# Patient Record
Sex: Female | Born: 1960 | Race: White | Hispanic: No | Smoking: Former smoker
Health system: Southern US, Community
[De-identification: ages and names within clinical notes are randomized; demographics above are authoritative.]

## PROBLEM LIST (undated history)

## (undated) DIAGNOSIS — I2510 Atherosclerotic heart disease of native coronary artery without angina pectoris: Secondary | ICD-10-CM

## (undated) DIAGNOSIS — F419 Anxiety disorder, unspecified: Secondary | ICD-10-CM

## (undated) DIAGNOSIS — K219 Gastro-esophageal reflux disease without esophagitis: Secondary | ICD-10-CM

## (undated) DIAGNOSIS — J439 Emphysema, unspecified: Secondary | ICD-10-CM

## (undated) DIAGNOSIS — G4733 Obstructive sleep apnea (adult) (pediatric): Secondary | ICD-10-CM

## (undated) DIAGNOSIS — F32A Depression, unspecified: Secondary | ICD-10-CM

## (undated) DIAGNOSIS — K589 Irritable bowel syndrome without diarrhea: Secondary | ICD-10-CM

## (undated) DIAGNOSIS — F329 Major depressive disorder, single episode, unspecified: Secondary | ICD-10-CM

## (undated) DIAGNOSIS — R079 Chest pain, unspecified: Secondary | ICD-10-CM

## (undated) DIAGNOSIS — Q248 Other specified congenital malformations of heart: Secondary | ICD-10-CM

## (undated) DIAGNOSIS — G43909 Migraine, unspecified, not intractable, without status migrainosus: Secondary | ICD-10-CM

## (undated) HISTORY — DX: Chest pain, unspecified: R07.9

## (undated) HISTORY — DX: Gastro-esophageal reflux disease without esophagitis: K21.9

## (undated) HISTORY — DX: Obstructive sleep apnea (adult) (pediatric): G47.33

## (undated) HISTORY — DX: Anxiety disorder, unspecified: F41.9

## (undated) HISTORY — DX: Other specified congenital malformations of heart: Q24.8

## (undated) HISTORY — PX: PARTIAL HYSTERECTOMY: SHX80

## (undated) HISTORY — DX: Migraine, unspecified, not intractable, without status migrainosus: G43.909

## (undated) HISTORY — PX: BREAST BIOPSY: SHX20

## (undated) HISTORY — DX: Irritable bowel syndrome, unspecified: K58.9

## (undated) HISTORY — DX: Emphysema, unspecified: J43.9

## (undated) HISTORY — DX: Atherosclerotic heart disease of native coronary artery without angina pectoris: I25.10

## (undated) HISTORY — DX: Depression, unspecified: F32.A

## (undated) HISTORY — DX: Major depressive disorder, single episode, unspecified: F32.9

---

## 2007-01-20 ENCOUNTER — Encounter: Payer: Self-pay | Admitting: Physician Assistant

## 2007-01-20 ENCOUNTER — Ambulatory Visit: Payer: Self-pay | Admitting: Cardiology

## 2007-01-29 ENCOUNTER — Ambulatory Visit: Payer: Self-pay | Admitting: Cardiology

## 2007-01-30 ENCOUNTER — Encounter: Payer: Self-pay | Admitting: Cardiology

## 2007-01-30 ENCOUNTER — Ambulatory Visit: Payer: Self-pay | Admitting: Cardiology

## 2007-01-30 ENCOUNTER — Inpatient Hospital Stay (HOSPITAL_BASED_OUTPATIENT_CLINIC_OR_DEPARTMENT_OTHER): Admission: RE | Admit: 2007-01-30 | Discharge: 2007-01-30 | Payer: Self-pay | Admitting: Cardiology

## 2007-02-05 ENCOUNTER — Ambulatory Visit: Payer: Self-pay | Admitting: Cardiology

## 2007-02-27 ENCOUNTER — Ambulatory Visit: Payer: Self-pay | Admitting: Cardiothoracic Surgery

## 2007-05-20 ENCOUNTER — Emergency Department (HOSPITAL_COMMUNITY): Admission: EM | Admit: 2007-05-20 | Discharge: 2007-05-20 | Payer: Self-pay | Admitting: Emergency Medicine

## 2007-05-28 ENCOUNTER — Ambulatory Visit (HOSPITAL_COMMUNITY): Admission: RE | Admit: 2007-05-28 | Discharge: 2007-05-28 | Payer: Self-pay | Admitting: Cardiothoracic Surgery

## 2007-05-28 ENCOUNTER — Ambulatory Visit: Payer: Self-pay | Admitting: Cardiovascular Disease

## 2007-06-05 ENCOUNTER — Ambulatory Visit: Payer: Self-pay | Admitting: Cardiothoracic Surgery

## 2007-10-07 ENCOUNTER — Encounter: Payer: Self-pay | Admitting: Cardiology

## 2007-10-15 ENCOUNTER — Ambulatory Visit: Payer: Self-pay | Admitting: Cardiology

## 2007-10-23 ENCOUNTER — Ambulatory Visit: Payer: Self-pay | Admitting: Cardiology

## 2007-10-23 ENCOUNTER — Encounter: Payer: Self-pay | Admitting: Physician Assistant

## 2007-11-13 ENCOUNTER — Encounter: Admission: RE | Admit: 2007-11-13 | Discharge: 2007-11-13 | Payer: Self-pay | Admitting: Cardiothoracic Surgery

## 2007-11-13 ENCOUNTER — Ambulatory Visit: Payer: Self-pay | Admitting: Cardiothoracic Surgery

## 2007-11-24 ENCOUNTER — Ambulatory Visit: Payer: Self-pay | Admitting: Cardiology

## 2008-03-15 ENCOUNTER — Encounter: Payer: Self-pay | Admitting: Cardiology

## 2008-05-20 ENCOUNTER — Encounter: Payer: Self-pay | Admitting: Cardiology

## 2008-12-21 ENCOUNTER — Ambulatory Visit (HOSPITAL_COMMUNITY): Admission: RE | Admit: 2008-12-21 | Discharge: 2008-12-21 | Payer: Self-pay | Admitting: Cardiothoracic Surgery

## 2008-12-26 ENCOUNTER — Ambulatory Visit: Payer: Self-pay | Admitting: Cardiothoracic Surgery

## 2009-03-06 ENCOUNTER — Ambulatory Visit: Payer: Self-pay | Admitting: Cardiology

## 2009-04-07 DIAGNOSIS — R079 Chest pain, unspecified: Secondary | ICD-10-CM | POA: Insufficient documentation

## 2009-04-10 DIAGNOSIS — I2510 Atherosclerotic heart disease of native coronary artery without angina pectoris: Secondary | ICD-10-CM | POA: Insufficient documentation

## 2009-06-12 HISTORY — PX: OTHER SURGICAL HISTORY: SHX169

## 2009-06-16 ENCOUNTER — Ambulatory Visit: Payer: Self-pay | Admitting: Cardiothoracic Surgery

## 2009-06-16 ENCOUNTER — Encounter: Admission: RE | Admit: 2009-06-16 | Discharge: 2009-06-16 | Payer: Self-pay | Admitting: Cardiothoracic Surgery

## 2009-06-22 ENCOUNTER — Encounter: Payer: Self-pay | Admitting: Cardiology

## 2009-06-26 ENCOUNTER — Inpatient Hospital Stay (HOSPITAL_COMMUNITY): Admission: RE | Admit: 2009-06-26 | Discharge: 2009-06-29 | Payer: Self-pay | Admitting: Cardiothoracic Surgery

## 2009-06-26 ENCOUNTER — Encounter: Payer: Self-pay | Admitting: Cardiothoracic Surgery

## 2009-06-26 ENCOUNTER — Ambulatory Visit: Payer: Self-pay | Admitting: Cardiothoracic Surgery

## 2009-06-29 ENCOUNTER — Encounter: Payer: Self-pay | Admitting: Cardiology

## 2009-07-03 ENCOUNTER — Ambulatory Visit: Payer: Self-pay | Admitting: Cardiothoracic Surgery

## 2009-07-10 ENCOUNTER — Ambulatory Visit: Payer: Self-pay | Admitting: Cardiothoracic Surgery

## 2009-07-10 ENCOUNTER — Encounter: Admission: RE | Admit: 2009-07-10 | Discharge: 2009-07-10 | Payer: Self-pay | Admitting: Cardiothoracic Surgery

## 2010-04-11 ENCOUNTER — Encounter: Payer: Self-pay | Admitting: Cardiology

## 2010-06-12 ENCOUNTER — Ambulatory Visit: Payer: Self-pay | Admitting: Cardiology

## 2010-06-12 DIAGNOSIS — F341 Dysthymic disorder: Secondary | ICD-10-CM | POA: Insufficient documentation

## 2010-06-12 DIAGNOSIS — I318 Other specified diseases of pericardium: Secondary | ICD-10-CM | POA: Insufficient documentation

## 2010-07-26 ENCOUNTER — Ambulatory Visit: Payer: Self-pay | Admitting: Cardiology

## 2010-07-26 DIAGNOSIS — J069 Acute upper respiratory infection, unspecified: Secondary | ICD-10-CM | POA: Insufficient documentation

## 2010-07-26 DIAGNOSIS — E785 Hyperlipidemia, unspecified: Secondary | ICD-10-CM | POA: Insufficient documentation

## 2010-09-02 ENCOUNTER — Encounter: Payer: Self-pay | Admitting: Cardiothoracic Surgery

## 2010-09-12 NOTE — Assessment & Plan Note (Signed)
Summary: 3yr/rm   Visit Type:  Follow-up Primary Alf Doyle:  Health Dept   History of Present Illness: the patient is a 50 year old female with a history of pericardial cyst removal.  Patient also has a history of anxiety disorder.  She had a recent breakup.  She partner for 11 years.  She occasionally has chest pain which is atypical.  She reports a decrease in appetite.  She feels very depressed.  She also has increased bowel movements with diarrhea.  She is unable to sleep.  Cardiovascular standpoint however she stable with no chest pain.  She does have palpitations an EKG in the office shows sinus tachycardia but otherwise normal EKG  Preventive Screening-Counseling & Management  Alcohol-Tobacco     Smoking Status: current     Smoking Cessation Counseling: yes     Packs/Day: 2&1/2 PPD  Current Medications (verified): 1)  Nitroglycerin 0.4 Mg Subl (Nitroglycerin) .... Place 1 Tablet Under Tongue As Directed 2)  Metoprolol Succinate 25 Mg Xr24h-Tab (Metoprolol Succinate) .... Take 1 Tablet By Mouth Once A Day 3)  Simvastatin 40 Mg Tabs (Simvastatin) .... Take 1 Tablet By Mouth At Bedtime 4)  Fluoxetine Hcl 40 Mg Caps (Fluoxetine Hcl) .... Take 1 Tablet By Mouth Once A Day. Obtain Further Refills From Primary Md. 5)  Aspirin 81 Mg Tbec (Aspirin) .... Take One Tablet By Mouth Daily 6)  Tylenol 325 Mg Tabs (Acetaminophen) .... As Needed 7)  Trazodone Hcl 100 Mg Tabs (Trazodone Hcl) .... Take 1/2 Tab (50mg ) At Bedtime, May Increase To 1 Tab If Needed 8)  Clonazepam 0.5 Mg Tabs (Clonazepam) .... Take 1 Tablet By Mouth Two Times A Day  Allergies (verified): 1)  ! Pcn 2)  ! Bactrim 3)  ! Ultram  Comments:  Nurse/Medical Assistant: The patient's medications and allergies were verbally reviewed with the patient and were updated in the Medication and Allergy Lists.  Past History:  Past Medical History: Last updated: 04/07/2009 CAD, NATIVE VESSEL (ICD-414.01) CHEST PAIN-UNSPECIFIED  (ICD-786.50) GERD  Past Surgical History: Last updated: 04/07/2009 Partial Hysterectomy  Family History: Last updated: 04/07/2009 Family History of Cancer:  Family History of CVA or Stroke:   Social History: Last updated: 04/07/2009 Tobacco Use - Former.  Alcohol Use - no Drug Use - no  Risk Factors: Smoking Status: current (06/12/2010) Packs/Day: 2&1/2 PPD (06/12/2010)  Past Cardiac History:  [facility] Hospitalizations: ADMITTING DIAGNOSES: 1. A 3-cm pericardial cyst. 2. History of atypical chest pain (with normal coronaries). 3. History of anxiety disorder. 4. History of tobacco abuse (recently quit).   DISCHARGE DIAGNOSES: 1. A 3-cm pericardial cyst. 2. History of atypical chest pain (with normal coronaries). 3. History of anxiety disorder. 4. History of tobacco abuse (recently quit).   PROCEDURE:  Left VAT, resection of pericardial cyst, placement of On-Q by [name] on June 26, 2009.  Pathology, benign pericardial cyst.  Social History: Smoking Status:  current Packs/Day:  2&1/2 PPD  Review of Systems       The patient complains of anorexia, weight loss, fatigue, malaise, chest pain, diarrhea, depression, and anxiety.  The patient denies fever, weight gain/loss, vision loss, decreased hearing, hoarseness, palpitations, shortness of breath, prolonged cough, wheezing, sleep apnea, coughing up blood, abdominal pain, blood in stool, nausea, vomiting, heartburn, incontinence, blood in urine, muscle weakness, joint pain, leg swelling, rash, skin lesions, headache, fainting, dizziness, enlarged lymph nodes, easy bruising or bleeding, and environmental allergies.    Vital Signs:  Patient profile:   50 year old female  Height:      64 inches Weight:      117 pounds BMI:     20.16  Vitals Entered By: Carlye Grippe (June 12, 2010 11:17 AM)  Physical Exam  Additional Exam:  General: Well-developed, well-nourished in no distress head: Normocephalic and  atraumatic eyes PERRLA/EOMI intact, conjunctiva and lids normal nose: No deformity or lesions mouth normal dentition, normal posterior pharynx neck: Supple, no JVD.  No masses, thyromegaly or abnormal cervical nodes lungs: Normal breath sounds bilaterally without wheezing.  Normal percussion heart: regular rate and rhythm with normal S1 and S2, no S3 or S4.  PMI is normal.  No pathological murmurs abdomen: Normal bowel sounds, abdomen is soft and nontender without masses, organomegaly or hernias noted.  No hepatosplenomegaly musculoskeletal: Back normal, normal gait muscle strength and tone normal pulsus: Pulse is normal in all 4 extremities Extremities: No peripheral pitting edema neurologic: Alert and oriented x 3 skin: Intact without lesions or rashes cervical nodes: No significant adenopathy psychologic: depressed affect    EKG  Procedure date:  06/12/2010  Findings:      sinus tachycardia heart rate of hundred beats/min otherwise normal tracing.  Impression & Recommendations:  Problem # 1:  OTHER SPECIFIED DISEASES OF PERICARDIUM (ICD-423.8) the patient is status post pericardial cyst removal.  She has no recurrent symptoms.  Problem # 2:  ANXIETY DEPRESSION (ICD-300.4) the patient has severe symptoms of anxiety and depression.  I increased her Prozac to 40 mg a day and added trazodone 50 Millis p.o. nightly.  We also have given her clonazepam .5 mg p.o. b.i.d. the patient will follow-up with me in the next couple of weeks to make sure she is tolerating this regimen.  I discussed with her at length the risks and side effects of the medications, as well as the potential benefits  Problem # 3:  CHEST PAIN-UNSPECIFIED (ICD-786.50) Assessment: Comment Only atypical and white related to anxiety. Her updated medication list for this problem includes:    Nitroglycerin 0.4 Mg Subl (Nitroglycerin) .Marland Kitchen... Place 1 tablet under tongue as directed    Metoprolol Succinate 25 Mg Xr24h-tab  (Metoprolol succinate) .Marland Kitchen... Take 1 tablet by mouth once a day    Aspirin 81 Mg Tbec (Aspirin) .Marland Kitchen... Take one tablet by mouth daily  Other Orders: EKG w/ Interpretation (93000)  Patient Instructions: 1)  Trazodone 100mg  at bedtime, start with 50mg  at first 2)  Clonazepam 0.5mg  two times a day  3)  Continue Prozac at current dose 4)  Stop Norvasc 5)  Begin Metoprolol ER 25mg  daily  6)  Peppermint Oil - one tab one hour before meals three times a day  7)  Follow up in  2 weeks Prescriptions: SIMVASTATIN 40 MG TABS (SIMVASTATIN) Take 1 tablet by mouth at bedtime  #30 x 6   Entered by:   Hoover Brunette, LPN   Authorized by:   Lewayne Bunting, MD, [facility]   Signed by:   Hoover Brunette, LPN on 47/82/9562   Method used:   Electronically to        [facility] # [phone]* (retail)       [address]       [place], [place]  [address]       Ph: [phone]       Fax: [phone]   RxID:   [id number] CLONAZEPAM 0.5 MG TABS (CLONAZEPAM) Take 1 tablet by mouth two times a day  #60 x 1   Entered by:   Hoover Brunette,  LPN   Authorized by:   Lewayne Bunting, MD, [facility]   Signed by:   Hoover Brunette, LPN on 66/01/3015   Method used:   Print then Give to Patient   RxID:   [id number]   Handout requested. METOPROLOL SUCCINATE 25 MG XR24H-TAB (METOPROLOL SUCCINATE) Take 1 tablet by mouth once a day  #30 x 6   Entered by:   Hoover Brunette, LPN   Authorized by:   Lewayne Bunting, MD, [facility]   Signed by:   Hoover Brunette, LPN on 42/70/6237   Method used:   Electronically to        [facility] # [phone]* (retail)       [address]       [place], [place]  [address]       Ph: [phone]       Fax: [phone]   RxID:   [id number]   Handout requested. TRAZODONE HCL 100 MG TABS (TRAZODONE HCL) take 1/2 tab (50mg ) at bedtime, may increase to 1 tab if needed  #30 x 1   Entered by:   Hoover Brunette, LPN   Authorized by:   Lewayne Bunting, MD, [facility]   Signed by:   Hoover Brunette, LPN on 96/78/9381   Method used:   Electronically to         [facility] # [phone]* (retail)       [address]       [place], [place]  [address]       Ph: [phone]       Fax: [phone]   RxID:   [id number]   Handout requested.

## 2010-09-12 NOTE — Cardiovascular Report (Signed)
Summary: Cardiac Catheterization  Cardiac Catheterization   Imported By: Dorise Hiss 04/09/2010 14:35:13  _____________________________________________________________________  External Attachment:    Type:   Image     Comment:   External Document

## 2010-09-12 NOTE — Letter (Signed)
Summary: Appointment -missed   [facility] [address]   [place], [place] [address]   Phone: [phone]  Fax: [phone]     April 11, 2010 MRN: [id number]     [facility] [address] [place], [place]  [address]     Dear Ms. YOUNG,  Our records indicate you missed your appointment on April 11, 2010                        with Dr.  Andee Lineman.   It is very important that we reach you to reschedule this appointment. We look forward to participating in your health care needs.   Please contact us at the number listed above at your earliest convenience to reschedule this appointment.   Sincerely,    Glass blower/designer

## 2010-09-13 NOTE — Assessment & Plan Note (Signed)
Summary: f2w  -- agh   Visit Type:  Follow-up Primary Provider:  Health Dept   History of Present Illness: the patient is a 50 year old female whom I saw several weeks ago with severe symptoms of anxiety and depression. The patient was going through a breakup with her partner. Her mood is much improved and she is much less tearful. However she is very concerned about ongoing weight loss. Since July she has lost 40 pounds and lost another 9 pounds since her last office visit. She is taking a higher dose of fluoxetine 40 mg daily as well as lorazepam low dose twice a day and p.r.n. trazodone for sleep. The patient states that she does not always need her trazodone. Overall she feels significantly improved.  The patient over the last 2 days does report a cough and upper airway congestion and purulent drainage from the nasal cavity. She's had fevers up to 102. She reports no shortness of breath orthopnea PND. She has noticed some cervical lymphadenopathy and has a sore throat.  from a cardiac standpoint the patient is stable. We have followed this patient for prior pericardial cyst removal. Prior cardiac catheterization showed no coronary artery disease.  Preventive Screening-Counseling & Management  Alcohol-Tobacco     Smoking Status: current     Smoking Cessation Counseling: yes     Packs/Day: <1 PPD  Current Medications (verified): 1)  Nitroglycerin 0.4 Mg Subl (Nitroglycerin) .... Place 1 Tablet Under Tongue As Directed 2)  Metoprolol Succinate 25 Mg Xr24h-Tab (Metoprolol Succinate) .... Take 1 Tablet By Mouth Once A Day 3)  Simvastatin 40 Mg Tabs (Simvastatin) .... Take 1 Tablet By Mouth At Bedtime 4)  Fluoxetine Hcl 40 Mg Caps (Fluoxetine Hcl) .... Take 1 Tablet By Mouth Once A Day. Obtain Further Refills From Primary Md. 5)  Aspirin 81 Mg Tbec (Aspirin) .... Take One Tablet By Mouth Daily 6)  Tylenol 325 Mg Tabs (Acetaminophen) .... As Needed 7)  Remeron 15 Mg Tabs (Mirtazapine) ....  Take 1 Tab By Mouth At Bedtime 8)  Clonazepam 0.5 Mg Tabs (Clonazepam) .... Take 1 Tablet By Mouth Two Times A Day 9)  Zithromax Z-Pak 250 Mg Tabs (Azithromycin) .... Take As Directed  Allergies (verified): 1)  ! Pcn 2)  ! Bactrim 3)  ! Ultram  Comments:  Nurse/Medical Assistant: The patient is currently on medications but does not know the name or dosage at this time. Instructed to contact our office with details. Will update medication list at that time.  Past History:  Past Medical History: Last updated: 04/07/2009 CAD, NATIVE VESSEL (ICD-414.01) CHEST PAIN-UNSPECIFIED (ICD-786.50) GERD  Past Cardiac History:  [facility] Hospitalizations: ADMITTING DIAGNOSES: 1. A 3-cm pericardial cyst. 2. History of atypical chest pain (with normal coronaries). 3. History of anxiety disorder. 4. History of tobacco abuse (recently quit).   DISCHARGE DIAGNOSES: 1. A 3-cm pericardial cyst. 2. History of atypical chest pain (with normal coronaries). 3. History of anxiety disorder. 4. History of tobacco abuse (recently quit).   PROCEDURE:  Left VAT, resection of pericardial cyst, placement of On-Q by [name] on June 26, 2009.  Pathology, benign pericardial cyst.  Social History: Packs/Day:  <1 PPD  Review of Systems       The patient complains of fatigue, hoarseness, prolonged cough, depression, and anxiety.  The patient denies malaise, fever, weight gain/loss, vision loss, decreased hearing, chest pain, palpitations, shortness of breath, wheezing, sleep apnea, coughing up blood, abdominal pain, blood in stool, nausea, vomiting, diarrhea, heartburn, incontinence,  blood in urine, muscle weakness, joint pain, leg swelling, rash, skin lesions, headache, fainting, dizziness, enlarged lymph nodes, easy bruising or bleeding, and environmental allergies.    Vital Signs:  Patient profile:   50 year old female Height:      64 inches Weight:      108 pounds Pulse rate:   86 / minute BP  sitting:   118 / 76  (left arm) Cuff size:   regular  Vitals Entered By: Carlye Grippe (July 26, 2010 10:00 AM)  Physical Exam  Additional Exam:  General: Well-developed, well-nourished in no distress head: Normocephalic and atraumatic eyes PERRLA/EOMI intact, conjunctiva and lids normal nose: No deformity or lesions mouth normal dentition, inflamed posterior pharynx neck: Supple, no JVD.  No masses, thyromegaly, some shotty cervical adenopathy is noted lungs: Normal breath sounds bilaterally without wheezing.  Normal percussion heart: regular rate and rhythm with normal S1 and S2, no S3 or S4.  PMI is normal.  No pathological murmurs abdomen: Normal bowel sounds, abdomen is soft and nontender without masses, organomegaly or hernias noted.  No hepatosplenomegaly musculoskeletal: Back normal, normal gait muscle strength and tone normal pulsus: Pulse is normal in all 4 extremities Extremities: No peripheral pitting edema neurologic: Alert and oriented x 3 skin: Intact without lesions or rashes cervical nodes: No significant adenopathy psychologic: depressed affect    Impression & Recommendations:  Problem # 1:  ANXIETY DEPRESSION (ICD-300.4) the patient has significant improvement under medical therapy for anxiety and depression. However she still remains somewhat tearful and she continues to lose weight which I suspect is secondary to her mood disorder. She does receive regular mammograms and regular physical checkups. Her weight loss is rather acute in aspect is related to her depression. I told the patient that we would DC trazodone and I would like for her to take mirtazapine 15 mg p.o. q.h.s. every night for insomnia and also to improve her appetite. I do think this is a better choice as is associated with weight gain. I will leave her on the low dose due to his interaction with fluoxetine causing an increase in mirtazapine levels. I also discussed with patient care for the  possibility of serotonin syndrome.  Problem # 2:  UPPER RESPIRATORY INFECTION (ICD-465.9) the patient appears to have an upper respiratory infection associated fevers up to 102, rhinitis and congestion. I do not think this represents a serotonin syndrome given the fact that the patient does not always take her trazodone. We will monitor closely for his however her clinical syndrome points towards an upper respiratory infection and have given the patient a Z-Pak for 5 days. Her updated medication list for this problem includes:    Zithromax Z-pak 250 Mg Tabs (Azithromycin) .Marland Kitchen... Take as directed  Problem # 3:  HYPERLIPIDEMIA (ICD-272.4) given the patient's significant weight loss I told her that he would be reasonable to stop simvastatin for now. In 6 months we can check her lipid panel. She has no historyof coronary artery disease and a statin and was only using the setting off primary prevention, which has no survival benefit in women for primary prevention. Her updated medication list for this problem includes:    Simvastatin 40 Mg Tabs (Simvastatin) .Marland Kitchen... Take 1 tablet by mouth at bedtime  Patient Instructions: 1)  Stop Trazodone  2)  Begin Remeron 15mg  at bedtime  3)  Z-pack x 5 days  4)  Follow up in  3 months Prescriptions: ZITHROMAX Z-PAK 250 MG TABS (AZITHROMYCIN) take  as directed  #1 x 0   Entered by:   Hoover Brunette, LPN   Authorized by:   Lewayne Bunting, MD, [facility]   Signed by:   Hoover Brunette, LPN on 36/64/4034   Method used:   Electronically to        [facility]* (retail)       [address]       [place], [place]  [address]       Ph: [phone]       Fax: [phone]   RxID:   [id number] REMERON 15 MG TABS (MIRTAZAPINE) Take 1 tab by mouth at bedtime  #30 x 2   Entered by:   Hoover Brunette, LPN   Authorized by:   Lewayne Bunting, MD, [facility]   Signed by:   Hoover Brunette, LPN on 32/20/2542   Method used:   Electronically to        [facility]  (retail)       [address]       [place], [place]  [address]       Ph: [phone]       Fax: [phone]   RxID:   [id number]   Handout requested.

## 2010-11-14 LAB — BLOOD GAS, ARTERIAL
Acid-Base Excess: 0.2 mmol/L (ref 0.0–2.0)
Acid-Base Excess: 2 mmol/L (ref 0.0–2.0)
Acid-Base Excess: 2 mmol/L (ref 0.0–2.0)
Acid-Base Excess: 4.9 mmol/L — ABNORMAL HIGH (ref 0.0–2.0)
Bicarbonate: 27.3 mEq/L — ABNORMAL HIGH (ref 20.0–24.0)
Bicarbonate: 27.4 mEq/L — ABNORMAL HIGH (ref 20.0–24.0)
Bicarbonate: 28.3 mEq/L — ABNORMAL HIGH (ref 20.0–24.0)
Bicarbonate: 29.1 mEq/L — ABNORMAL HIGH (ref 20.0–24.0)
Delivery systems: POSITIVE
Drawn by: 129711
Drawn by: 129711
Drawn by: 132
Drawn by: 206361
Expiratory PAP: 6
FIO2: 0.21 %
FIO2: 0.4 %
FIO2: 0.5 %
Inspiratory PAP: 12
Mode: POSITIVE
O2 Content: 2 L/min
O2 Saturation: 97.9 %
O2 Saturation: 98.5 %
O2 Saturation: 99.4 %
O2 Saturation: 99.5 %
PEEP: 6 cmH2O
Patient temperature: 98.6
Patient temperature: 98.6
Patient temperature: 98.6
Patient temperature: 98.6
Pressure support: 12 cmH2O
RATE: 14 resp/min
TCO2: 29 mmol/L (ref 0–100)
TCO2: 29.4 mmol/L (ref 0–100)
TCO2: 30.2 mmol/L (ref 0–100)
TCO2: 30.4 mmol/L (ref 0–100)
pCO2 arterial: 44.5 mmHg (ref 35.0–45.0)
pCO2 arterial: 53.3 mmHg — ABNORMAL HIGH (ref 35.0–45.0)
pCO2 arterial: 63.6 mmHg (ref 35.0–45.0)
pCO2 arterial: 70.8 mmHg (ref 35.0–45.0)
pH, Arterial: 7.21 — ABNORMAL LOW (ref 7.350–7.400)
pH, Arterial: 7.27 — ABNORMAL LOW (ref 7.350–7.400)
pH, Arterial: 7.331 — ABNORMAL LOW (ref 7.350–7.400)
pH, Arterial: 7.431 — ABNORMAL HIGH (ref 7.350–7.400)
pO2, Arterial: 143 mmHg — ABNORMAL HIGH (ref 80.0–100.0)
pO2, Arterial: 218 mmHg — ABNORMAL HIGH (ref 80.0–100.0)
pO2, Arterial: 225 mmHg — ABNORMAL HIGH (ref 80.0–100.0)
pO2, Arterial: 87.4 mmHg (ref 80.0–100.0)

## 2010-11-14 LAB — COMPREHENSIVE METABOLIC PANEL
ALT: 15 U/L (ref 0–35)
ALT: 16 U/L (ref 0–35)
AST: 20 U/L (ref 0–37)
AST: 16 U/L (ref 0–37)
Albumin: 3.4 g/dL — ABNORMAL LOW (ref 3.5–5.2)
Albumin: 3.9 g/dL (ref 3.5–5.2)
Alkaline Phosphatase: 48 U/L (ref 39–117)
Alkaline Phosphatase: 66 U/L (ref 39–117)
BUN: 3 mg/dL — ABNORMAL LOW (ref 6–23)
BUN: 13 mg/dL (ref 6–23)
CO2: 31 mEq/L (ref 19–32)
CO2: 26 mEq/L (ref 19–32)
Calcium: 9.4 mg/dL (ref 8.4–10.5)
Calcium: 9.5 mg/dL (ref 8.4–10.5)
Chloride: 102 mEq/L (ref 96–112)
Chloride: 104 mEq/L (ref 96–112)
Creatinine, Ser: 0.56 mg/dL (ref 0.4–1.2)
Creatinine, Ser: 0.79 mg/dL (ref 0.4–1.2)
GFR calc Af Amer: >60 mL/min (ref >60)
GFR calc Af Amer: >60 mL/min (ref >60)
GFR calc non Af Amer: >60 mL/min (ref >60)
GFR calc non Af Amer: >60 mL/min (ref >60)
Glucose, Bld: 112 mg/dL — ABNORMAL HIGH (ref 70–99)
Glucose, Bld: 81 mg/dL (ref 70–99)
Potassium: 4.5 mEq/L (ref 3.5–5.1)
Potassium: 4.1 mEq/L (ref 3.5–5.1)
Sodium: 139 mEq/L (ref 135–145)
Sodium: 138 mEq/L (ref 135–145)
Total Bilirubin: 0.5 mg/dL (ref 0.3–1.2)
Total Bilirubin: 0.4 mg/dL (ref 0.3–1.2)
Total Protein: 6.6 g/dL (ref 6.0–8.3)
Total Protein: 6.8 g/dL (ref 6.0–8.3)

## 2010-11-14 LAB — URINALYSIS, ROUTINE W REFLEX MICROSCOPIC
Bilirubin Urine: NEGATIVE
Glucose, UA: NEGATIVE mg/dL
Hgb urine dipstick: NEGATIVE
Ketones, ur: NEGATIVE mg/dL
Nitrite: NEGATIVE
Protein, ur: NEGATIVE mg/dL
Specific Gravity, Urine: 1.022 (ref 1.005–1.030)
Urobilinogen, UA: 1 mg/dL (ref 0.0–1.0)
pH: 7 (ref 5.0–8.0)

## 2010-11-14 LAB — POCT I-STAT 3, ART BLOOD GAS (G3+)
Acid-Base Excess: 2 mmol/L (ref 0.0–2.0)
Acid-Base Excess: 2 mmol/L (ref 0.0–2.0)
Acid-Base Excess: 3 mmol/L — ABNORMAL HIGH (ref 0.0–2.0)
Bicarbonate: 28.6 mEq/L — ABNORMAL HIGH (ref 20.0–24.0)
Bicarbonate: 28.5 mEq/L — ABNORMAL HIGH (ref 20.0–24.0)
Bicarbonate: 29.2 mEq/L — ABNORMAL HIGH (ref 20.0–24.0)
O2 Saturation: 95 %
O2 Saturation: 98 %
O2 Saturation: 99 %
Patient temperature: 98.4
Patient temperature: 98.5
Patient temperature: 98.6
TCO2: 30 mmol/L (ref 0–100)
TCO2: 30 mmol/L (ref 0–100)
TCO2: 31 mmol/L (ref 0–100)
pCO2 arterial: 49.3 mmHg — ABNORMAL HIGH (ref 35.0–45.0)
pCO2 arterial: 50.6 mmHg — ABNORMAL HIGH (ref 35.0–45.0)
pCO2 arterial: 51.5 mmHg — ABNORMAL HIGH (ref 35.0–45.0)
pH, Arterial: 7.371 (ref 7.350–7.400)
pH, Arterial: 7.351 (ref 7.350–7.400)
pH, Arterial: 7.369 (ref 7.350–7.400)
pO2, Arterial: 77 mmHg — ABNORMAL LOW (ref 80.0–100.0)
pO2, Arterial: 114 mmHg — ABNORMAL HIGH (ref 80.0–100.0)
pO2, Arterial: 139 mmHg — ABNORMAL HIGH (ref 80.0–100.0)

## 2010-11-14 LAB — CBC
HCT: 33.3 % — ABNORMAL LOW (ref 36.0–46.0)
HCT: 33.8 % — ABNORMAL LOW (ref 36.0–46.0)
HCT: 34 % — ABNORMAL LOW (ref 36.0–46.0)
Hemoglobin: 11.6 g/dL — ABNORMAL LOW (ref 12.0–15.0)
Hemoglobin: 11.7 g/dL — ABNORMAL LOW (ref 12.0–15.0)
Hemoglobin: 12 g/dL (ref 12.0–15.0)
MCHC: 34.7 g/dL (ref 30.0–36.0)
MCHC: 34.8 g/dL (ref 30.0–36.0)
MCHC: 35.2 g/dL (ref 30.0–36.0)
MCV: 99.7 fL (ref 78.0–100.0)
MCV: 99.7 fL (ref 78.0–100.0)
MCV: 98.3 fL (ref 78.0–100.0)
Platelets: 249 10*3/uL (ref 150–400)
Platelets: 280 10*3/uL (ref 150–400)
Platelets: 254 10*3/uL (ref 150–400)
RBC: 3.34 MIL/uL — ABNORMAL LOW (ref 3.87–5.11)
RBC: 3.39 MIL/uL — ABNORMAL LOW (ref 3.87–5.11)
RBC: 3.46 MIL/uL — ABNORMAL LOW (ref 3.87–5.11)
RDW: 13.2 % (ref 11.5–15.5)
RDW: 13.3 % (ref 11.5–15.5)
RDW: 13.1 % (ref 11.5–15.5)
WBC: 10.6 10*3/uL — ABNORMAL HIGH (ref 4.0–10.5)
WBC: 8.4 10*3/uL (ref 4.0–10.5)
WBC: 6.9 10*3/uL (ref 4.0–10.5)

## 2010-11-14 LAB — TYPE AND SCREEN
ABO/RH(D): O POS
Antibody Screen: NEGATIVE

## 2010-11-14 LAB — BASIC METABOLIC PANEL
BUN: 4 mg/dL — ABNORMAL LOW (ref 6–23)
CO2: 29 mEq/L (ref 19–32)
Calcium: 9.1 mg/dL (ref 8.4–10.5)
Chloride: 105 mEq/L (ref 96–112)
Creatinine, Ser: 0.51 mg/dL (ref 0.4–1.2)
GFR calc Af Amer: >60 mL/min (ref >60)
GFR calc non Af Amer: >60 mL/min (ref >60)
Glucose, Bld: 121 mg/dL — ABNORMAL HIGH (ref 70–99)
Potassium: 4 mEq/L (ref 3.5–5.1)
Sodium: 138 mEq/L (ref 135–145)

## 2010-11-14 LAB — PROTIME-INR
INR: 1.02 (ref 0.00–1.49)
Prothrombin Time: 13.3 seconds (ref 11.6–15.2)

## 2010-11-14 LAB — URINE MICROSCOPIC-ADD ON

## 2010-11-14 LAB — APTT: aPTT: 33 seconds (ref 24–37)

## 2010-11-14 LAB — ABO/RH: ABO/RH(D): O POS

## 2010-11-14 LAB — MRSA PCR SCREENING: MRSA by PCR: NEGATIVE

## 2010-12-25 NOTE — Consult Note (Signed)
NEW PATIENT CONSULTATION   [place], [name] [name]  DOB:  [birth year]                                        February 27, 2006  CHART #:  [id number]   REASON FOR CONSULTATION:  A 3x1 cm pericardial cyst by CT scan.   CHIEF COMPLAINT:  Atypical chest pain.   HISTORY OF PRESENT ILLNESS:  I was asked to evaluate this 50 year old  white female, ex-smoker, who recently was evaluated for atypical chest  pain and [facility].  She ruled out for MI and had no acute EKG  changes.  A stress test was performed (Cardiolite) which was equivocal,  and ischemia could not be ruled out.  He subsequently underwent cardiac  catheterization by Dr. Charlies Constable in mid June of this year which  demonstrated a mild 50% stenosis of the mid LAD, otherwise, no other  significant coronary artery disease and no evidence of valvular heart  disease.  The patient underwent a CT scan to rule out other etiology of  her pain, and this showed a pericardial cyst measuring 3x1 cm at the  left aspect of the cardiac border.  There is no pericardial effusion.  No pulmonary nodules or masses.  No pleural effusion.  At the same time,  an abnormality in the left breast was felt, and a subsequent mammogram  by her report demonstrated fibrocystic changes and no biopsies or  further studies are planned.  Because of the finding on the CT scan of  the probable incidental pericardial cyst, a thoracic surgical evaluation  was requested.  Of not, the patient's mother recently was found to have  breast cancer.   PAST MEDICAL HISTORY:  1. Status post hysterectomy.  2. Anxiety disorder.  3. Allergy to penicillin.   MEDICATIONS:  1. Klonopin q.h.s.  2. Aspirin 81 mg daily.   SOCIAL HISTORY:  The patient lives with her family, quit smoking two  years ago.  She denies significant alcohol intake.   FAMILY HISTORY:  Sister has tachy palpitations on a beta blocker, and  her mother recently died of cancer.   REVIEW OF SYSTEMS:  CONSTITUTIONAL:  Negative for fever or weight loss.  ENT:  Negative for difficulty swallowing or dental problems.  THORACIC:  Negative for chest trauma or recent upper respiratory infection or  hemoptysis.  CARDIAC:  Negative for significant coronary disease by  recent cath.  Good LV function.  She has a small pericardial cyst.  GI:  Positive for mild nausea.  Negative for blood __________ , hepatitis or  jaundice.  ENDOCRINE:  Negative for diabetes.  VASCULAR:  Negative for  DVT, claudication or TIA.  NEUROLOGICAL:  Negative for stroke or  seizure.   PHYSICAL EXAMINATION:  VITAL SIGNS:  The patient is 5 feet 4 inches and  weighs 133 pounds, blood pressure 114/70, pulse 90, respirations 18,  saturation 98%.  GENERAL:  Pleasant, anxious, middle-aged white female in no acute  distress.  HEENT:  Normocephalic, atraumatic.  EOMs are full.  NECK:  Without JV mass, crepitus or bruits.  LYMPHATICS:  No palpable, supraclavicular or cervical adenopathy.  BREASTS:  Deferred.  LUNGS:  Breath sounds are clear.  CARDIAC:  Regular without S3 gallop or murmur.  ABDOMEN:  Soft without pulsatile mass or organomegaly.  EXTREMITIES:  No cyanosis, clubbing or edema.  Peripheral pulses are 2+  in all extremities.  NEUROLOGICAL:  Intact.   LABORATORY DATA:  I reviewed the patient's prior cath last month.  I  reviewed her CT scan taken at [facility] with the disk that she  brought to the office.  She has a round, well-circumscribed cystic mass  at the left lateral pericardial border.  There is no associated  deformity of the myocardium or pericardial effusion.   IMPRESSION/PLAN:  The patient has an incidental pericardial cyst found  on x-ray.  It is small, and there is no concern this is cause to any of  her symptoms.  I would recommend following the cyst with serial scans.  Would opt for cardiac MRI to reduce long-term radiation exposure.  The  patient will be scheduled for a  cardiac MRI later this fall for initial  baseline exam.  It is rarely that becomes larger, a left _VATS_________  excision would be appropriate to discuss.   [name] [name], M.D.  Electronically Signed   PV/MEDQ  D:  02/27/2007  T:  02/28/2007  Job:  [id number]   cc:   Learta Codding, MD,FACC

## 2010-12-25 NOTE — Assessment & Plan Note (Signed)
[facility]                          [facility] NOTE   NAME:[name] [name], [name] [name]            MRN:          [id number]  DATE:11/24/2007                            DOB:          16-Jun-1961    CARDIOLOGIST:  Learta Codding, MD,FACC.   PRIMARY CARE PHYSICIAN:  None.   REASON FOR VISIT:  One-month follow-up.   HISTORY OF PRESENT ILLNESS:  Ms. [name] [name] is a 50 year old female ex-smoker  who also has a history of nonobstructive coronary artery disease as well  as a 1 x 3 cm pericardial cyst followed by Dr. Kathlee Nations Trigt.  She  returns to the office today for follow-up.  I last saw her in March and  she had had a recent visit to the hospital with chest pain.  Her chest  pain was felt to be fairly atypical.  However, with her history of  nonobstructive coronary disease, we decided to send her for a stress  echocardiogram.  She also complained of significant dysphagia and we set  her up for a barium swallow and placed her on ranitidine 150 mg b.i.d.   The patient's stress echocardiogram was done October 23, 2007.  This was a  normal stress echo study.  There was no scar or ischemia.  Of note, the  patient did exercise for 9 minutes.  Her barium swallow was a normal  study.   In the office today, the patient notes she is doing about the same.  She  denies any recurrent chest pain or shortness breath.  Denies any  syncope, near syncope, orthopnea, PND or pedal edema.  She continues to  note significant problems with dysphagia, odynophagia.  She denies any  recent antibiotic use.   CURRENT MEDICATIONS:  1. Aspirin 81 mg daily.  2. Norvasc 5 mg daily.  3. Multivitamin and B12.  4. Hyomax 0.125 mg p.r.n.  5. Ranitidine 150 mg b.i.d.  6. Simvastatin 20 mg nightly.   ALLERGIES:  PENICILLIN   PHYSICAL EXAMINATION:  GENERAL APPEARANCE:  She is a well-nourished  female in no acute distress.  VITAL SIGNS:  Blood pressure 117/70, pulse 75, weight  143 pounds.  HEENT:  Head:  Normocephalic, atraumatic.  Eyes:  PERRLA, EOMI.  Sclerae  are clear.  Oropharynx is pink without exudate.  LYMPH:  Without lymphadenopathy.  CARDIAC:  Normal S1 and S2, regular rate and rhythm.  LUNGS:  Clear to auscultation bilaterally.  ABDOMEN:  Soft, nontender with normoactive bowel sounds, no  organomegaly.  EXTREMITIES:  Without edema.  SKIN:  Warm and dry.  NEUROLOGIC:  She is alert and oriented x3.  Cranial nerves II-XII  grossly intact.  ENDOCRINE:  Without thyromegaly.   IMPRESSION:  1. Nonobstructive coronary artery disease by cardiac catheterization      June 2008 (50% proximal left anterior descending lesion).      a.     Nonischemic stress echocardiogram March 2009.  2. Good left ventricular function.  3. Gastroesophageal reflux disease with dysphagia and odynophagia.  4. A 1 x 3 cm pericardial cyst followed by [name].      a.  Follow-up CT scan planned in October 2009.  5. Ex-smoker.  6. Family history of lung cancer.   PLAN:  Ms. [name] [name] presents back to the office today for follow-up.  As  noted, her stress echocardiogram was normal.  She has had significant  problems with odynophagia and dysphagia.  We tried her on H2 receptor  blocker and ordered a barium swallow.  The barium swallow returned  normal.  She continues to have significant problems.  I think it is best  that she be evaluated by gastroenterology for further recommendations.  We will refer her to Dr. Karilyn Cota.  No medication changes will be made  today.  The patient will be brought back in follow-up in the next six  months or sooner p.r.n.  As noted, she has follow-up planned with Dr.  Donata Clay in October 2009.  She tells me that at her last visit with Dr.  Donata Clay, it was felt that her pericardial cyst had not changed in size  and was stable and no plans for surgery are imminent at this point in  time.      Tereso Newcomer, PA-C  Electronically  Signed      Learta Codding, MD,FACC  Electronically Signed   SW/MedQ  DD: 11/24/2007  DT: 11/24/2007  Job #: [id number]

## 2010-12-25 NOTE — Assessment & Plan Note (Signed)
[facility]                          [facility] NOTE   NAME:[name], [name]            MRN:          [id number]  DATE:10/15/2007                            DOB:          [birth year]    CARDIOLOGIST:  [name] Codding, MD.   PRIMARY CARE PHYSICIAN:  None.   REASON FOR VISIT:  A 6 month followup.   HISTORY OF PRESENT ILLNESS:  Ms. [name] [name] is a 50 year old female patient  with a history of nonobstructive coronary disease.  She had an abnormal  Cardiolite study done in June 2008 after presenting with chest pain.  Her cardiac catheterization revealed a 50% lesion in the LAD in the  proximal portion.  This was not felt to be obstructive and was not  causing her pain.  She was placed on a calcium channel blocker.  She was  then diagnosed with a 1-3 cm pericardial cyst.  This was noted on a  chest x-ray and then confirmed on CT scan.  She has seen Dr. Kathlee Nations  [name] for evaluation of this.  He plans to see her back sometime this  spring to repeat a chest x-ray and decide on whether or not she needs  surgery.  Since last being seen in the office, the patient has been  hospitalized on one occasion for chest pain.  This was actually last  week.  She developed substernal chest pain that felt heavy.  It waxed  and waned throughout the day and she eventually went to the emergency  room.  She was admitted for observation.  Cardiac enzymes were negative  x3.  She was apparently seen by Dr. Doyne [name] who prescribed Levsin p.r.n.  for possible GI etiology.   The patient notes continued substernal chest pain with exertion.  She  notes chest heaviness as well as arm discomfort.  She denies any  diaphoresis associated with this.  She does note some nausea.  She  denies any syncope or near syncope.  She denies orthopnea, PND or pedal  edema.  She notes dyspnea with exertion.  She really describes NYHA  class I to II symptoms.   The patient also tells  me that she has significant amounts of dysphagia  as well as odynophagia.  This has been ongoing for years, but has  worsened over the last year.  She also notes positive waterbrash  symptoms as well as increased belching.   CURRENT MEDICATIONS:  1. Aspirin 81 mg daily.  2. Norvasc 5 mg daily.  3. Multivitamin daily.  4. B12.  5. Goody Powders p.r.n.  6. Advil p.r.n.  7. Tylenol p.r.n.  8. Nitroglycerin p.r.n.  9. Voltaren p.r.n.  10.Hyoscyamine p.r.n.   ALLERGIES:  PENICILLIN.   PHYSICAL EXAMINATION:  GENERAL:  She is a well-nourished, well-developed  female in no acute distress.  VITAL SIGNS:  Blood pressure 130/75, pulse 79, weight 140.4 pounds.  Oxygen saturation 99% on room air.  HEENT:  Normal.  NECK:  Without JVD.  LYMPH:  Without lymphadenopathy.  ENDOCRINE:  Without thyromegaly.  CARDIAC:  Normal S1 and S2, regular rate and rhythm.  LUNGS:  Clear to auscultation bilaterally.  ABDOMEN:  Soft, nontender.  EXTREMITIES:  Without edema.  Calves are soft, nontender.  SKIN:  Warm, dry.  NEUROLOGIC:  She is alert and oriented x3.  Cranial nerves II-XII are  grossly intact.   DIAGNOSTICS:  Electrocardiogram from October 09, 2007 at [facility] revealed normal sinus rhythm, heart rate of 81, normal axis, no  acute changes.   IMPRESSION:  1. Chest pain.  2. Gastroesophageal reflux disease.      a.     h/o dysphagia/odynophagia.  3. Nonobstructive coronary artery disease as outlined above.  4. Good left ventricular function.  5. Pericardial cyst (1-3 cm), followed by Dr. Donata [name].  6. Ex-smoker.  7. Family history of lung cancer.   PLAN:  1. Proceed with stress echocardiogram to rule out the possibility of      ischemia.  She did have an equivocal Cardiolite study prior to her      cardiac catheterization.  She continues to have chest discomfort      that is somewhat concerning.  However, her cardiac catheterization      was less than a year ago.  2. She  also describes a significant amount of dysphagia as well as      acid reflux symptoms.  We will set her up for a barium swallow      study.  We will also start her on ranitidine 150 mg b.i.d.  If her      barium swallow is significantly abnormal, we will refer her to      gastroenterology.  3. With her coronary artery disease, she should be treated for      hyperlipidemia.  We will start her on simvastatin 20 mg q.h.s. and      recheck lipids and LFTs in about 12 weeks.  4. She has been asked to refrain from NSAID use as much as possible      given her acid reflux      disease symptoms and significant dysphagia.  5. We will have the patient return in 4 weeks for review of the above      testing.      [name] Newcomer, PA-C  Electronically Signed      [name] Codding, MD,FACC  Electronically Signed   SW/MedQ  DD: 10/15/2007  DT: 10/15/2007  Job #: [id number]

## 2010-12-25 NOTE — Assessment & Plan Note (Signed)
[facility]                          [facility] NOTE   NAME:[name] [name], [name] [name]            MRN:          [id number]  DATE:01/29/2007                            DOB:          1960-11-23    HISTORY OF PRESENT ILLNESS:  The patient is a 50 year old female with no  prior history of heart disease.  The patient was admitted on January 16, 2007 when she presented with an episode of severe substernal chest pain.  The patient reports to me that she described as feeling an elephant on  her chest.  She rated the pain at 5/10 with no radiation, associated  nausea or vomiting, or diaphoresis.  The patient presented herself to  the emergency room at that time, and was admitted.  She had no acute EKG  change, and she was ruled out for myocardial infarction.  She was under  the service of Dr. Felicie Morn at that time.  The patient was discharged, and  outpatient Cardiolite study was arranged.  The patient exercised  according to Bruce protocol for 9 minutes 38 seconds, and was achieve a  workload of 10.9 mets.  There were equivocal ST segment changes per Dr.  Diona Browner.  There was also small apical defect thought to represent soft  tissue attenuation, but ischemia could not be ruled out.  The ejection  fraction was 58%.  The patient called the hospital back to get the  results of her study, and then called our office to discuss this.  No  referral, however, was initially made.  The patient also had seen the  Health Department in the meanwhile and complained of ongoing chest pain.  This was felt to be secondary to costochondritis.  The patient states  over the last several months she has chest pain upon exertion, and  worsened with anxiety.  At times there is associated shortness of  breath.  A chest x-ray was also performed in the hospital, which showed  a haziness in the left lung base.  The patient has brought this to my  attention, and is quite concerned  about this given a family history of  lung cancer.   ALLERGIES:  PENICILLIN.   MEDICATIONS:  1. Klonopin.  2. Aspirin 81 mg a day.   PAST MEDICAL HISTORY:  1. Hysterectomy.  2. History of anxiety disorder.  3. History of breast lump.  Is scheduled for mammography per prior      notes.   FAMILY HISTORY:  Sister has tachy palpitations, on beta blocker.  Mother  died of lung cancer.   SOCIAL HISTORY:  The patient lives with her family.  She quit smoking  approximately 2 years ago, but unfortunately, she smoked up to 2 to 3  packs a day for 30 years.  She denies any alcohol use or recreational  drugs.  She is single and lives at home with her family.   REVIEW OF SYSTEMS:  No fever or chills.  No nausea or vomiting.  No  orthopnea or PND.  No palpitations or syncope.  No melena, hematochezia,  dysuria or frequency.  No neurological symptoms.   PHYSICAL EXAMINATION:  VITAL SIGNS:  Blood pressure 117/74.  Heart rate  71 beats per minute.  GENERAL:  Well-nourished white female in no apparent distress.  HEENT:  Pupils are equal.  Conjunctivae clear.  NECK:  Supple.  Normal carotid upstroke.  No carotid bruits.  LUNGS:  Clear breath sounds bilaterally.  HEART:  Regular rate and rhythm.  Normal S1 and S2.  No murmurs, rubs,  or gallops.  ABDOMEN:  Soft and non-tender.  No rebound or guarding.  Good bowel  sounds.  EXTREMITY EXAM:  No cyanosis, clubbing, or edema.  NEUROLOGIC:  Patient alert and oriented.  Grossly nonfocal.   PROBLEM LIST:  1. Atypical chest pain.      a.     Equivocal Cardiolite stress study.      b.     Rule out costochondritis.      c.     Rule out anxiety.      d.     Longstanding history of tobacco use up to 3 packs a day.  2. Haziness left lung field on chest x-ray.      a.     Family history of lung cancer.      b.     Breast lump (scheduled for mammography).   PLAN:  1. I had a long discussion with the patient as it relates to her chest      pain.  I  do feel her chest pain is atypical, but she has a high      possibility of coronary artery disease.  She also has an equivocal      stress test.  I gave the patient the option to proceed with a      cardiac CT scan versus cardiac catheterization.  She has opted for      the latter.  We will arrange JV catheterization for this patient in      the next couple of days.  I provided the patient with aspirin and      p.r.n. nitroglycerin.  I have also told her that if she has      recurrent chest pain, she should present herself to the emergency      room.  2. The patient has evidence of some haziness in the left lung base on      chest x-ray, and this needs to be followed up.  If the patient had      opted for a cardiac CT scan, certainly this could have been      evaluated at the same time, but based on the fact that we will      proceed with catheterization first, we will need to make sure that      we perform a CT scan of the chest after her procedure to follow up      and rule out the possibility of malignancy.  3. The patient has a history of breast lump, and this has been      addressed by Dr. Felicie Morn in the hospital, and I understand      mammography has been scheduled.  4. We will follow up with the patient after her cardiac      catheterization.     Learta Codding, MD,FACC  Electronically Signed    GED/MedQ  DD: 01/29/2007  DT: 01/29/2007  Job #: [id number]

## 2010-12-25 NOTE — H&P (Signed)
HISTORY AND PHYSICAL EXAMINATION   June 16, 2009   Re:  [place], [place] M           DOB:  01-05-1961   ADMISSION DIAGNOSIS:  A 3-cm left pericardial cyst.   CHIEF COMPLAINT:  Left-sided chest pain ongoing x2 years.   HISTORY OF PRESENT ILLNESS:  [name] [name] is a 50 year old Caucasian  female ex-smoker who has been followed since 2008 for a pericardial cyst  noted as an incidental finding on a CT scan when she presented with  chest pain and had a CT scan to rule out pulmonary embolus.  She  subsequently underwent cardiac catheterization by Dr. Juanda Chance which  showed a mild 50% stenosis in the mid-LAD and otherwise no significant  CAD.  Her LVEF was normal.  I followed the patient with yearly MRI scans  and there has been no change in the 3-cm pericardial cyst on the left  lateral border of the pericardium.  She has had significant problems  with atypical chest pain which have persisted which in the past have  suggested a GI etiology with GERD or esophageal spasm and she has been  treated with Norvasc and ranitidine.  She has also been treated for  irritable bowel with Dicyclomine (Bentyl).   She is allergic to PENICILLIN with unknown reaction as a child.   She is also taking aspirin 81 mg, nitroglycerin p.r.n. and Zocor 20 mg a  day and Prozac 40 mg daily.  She is being followed by Dr. Lewayne Bunting for  her atypical chest pain.   PAST MEDICAL HISTORY:  1. Anxiety disorder.  2. Possible irritable bowel syndrome.  3. Atypical chest pain with normal coronaries.  4. Stable pericardial cysts 3 x 1.4 cm followed since 2008.   MEDICATIONS:  Aspirin, Norvasc, nitroglycerin, Zocor, Prozac, and  Bentyl.   SOCIAL HISTORY:  The patient stopped smoking several months ago.  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   FAMILY HISTORY:  Negative for thoracic neoplasm.   REVIEW OF SYSTEMS:  CONSTITUTIONAL:  Positive for mild weight loss over  2 years of 10 pounds.  No fever or  night sweats.  ENT:  Review is  negative for dental complaints or difficulty swallowing.  THORACIC:  Review is negative for history of chest trauma and her chest x-rays have  been clear with exception of the opacity from the pericardial cyst.  GI:  Review is as stated previously.  VASCULAR:  Review is negative DVT,  claudication or TIA.  NEUROLOGIC:  Review is negative stroke or seizure.  PSYCHOLOGICAL:  Review is positive for emotional lability, depression  and anxiety.   PHYSICAL EXAMINATION:  VITAL SIGNS:  She is 5 feet 4 and weighs 120  pounds.  Blood pressure 116/70, pulse 60, respirations 18, saturation  98%.  She is alert and pleasant.  HEENT:  Exam is normocephalic.  NECK:  Without JVD, mass or adenopathy.  RESPIRATORY:  Breath sounds are clear and equal.  CARDIAC:  Exam is regular rhythm without S3, gallop.  ABDOMINAL:  Exam is soft, nontender.  EXTREMITIES:  Reveal no clubbing, cyanosis or edema.  Peripheral pulses  are intact.  NEUROLOGIC:  Alert and oriented without focal motor deficit.   LABORATORY DATA:  Her chest MRI shows a stable pericardial cyst.  There  were no other new findings and the cyst is stable.   IMPRESSION AND PLAN:  The patient has had persistent left-sided chest  pain in the vicinity of this  pericardial cyst.  She has been through  extensive evaluation of her coronaries and she has been treated for  gastroesophageal reflux disease and irritable bowel without success.  The pain is almost daily and has affected her functional level.  I feel  that resection of the cyst is indicated at this time although I have  made it clear to the patient that she may still have persistent pain  after removing this small cyst and that she would definitely have post-  thoracotomy incisional pain for at least 3 months following the surgery.   She will be scheduled for left VATS and resection of the pericardial  cyst in approximately 1 week on November 15.  We discussed  the details  of the surgery including the use of general anesthesia, location of the  surgical incision, the expected postoperative hospital recovery, and the  potential risks of pneumonia and postoperative pain.  She understands  and agrees to proceed with surgery.   Kerin Perna, M.D.  Electronically Signed   PV/MEDQ  D:  06/16/2009  T:  06/16/2009  Job:  [id number]   cc:   Learta Codding, MD,FACC  [place] [facility]

## 2010-12-25 NOTE — Letter (Signed)
February 17, 2007     RE:  ISLAM, VILLESCAS  MRN:  [id number]  /  DOB:  1960-08-22   To Whom It May Concern:   Ms. Madeeha Costantino is a patient of mine.  She has reported atypical chest  pain, and underwent a cardiac catheterization.  The patient has no  evidence of significant coronary artery disease, and her pain is felt to  be of noncardiac in nature.  She also had a low-risk stress study.  She  was found to have a pericardial cyst on CT scan, but this is  asymptomatic.   From a medical perspective, there appears to be no reason why this  patient cannot perform her daily activities, as well as her daily  profession.  The patient is at low acute risk for any complications.  From my perspective, she is medically cleared to continue her usual work  activity.   If you have any further questions regarding this letter, please do not  hesitate to contact Dr. Andee Lineman at the [facility] at [phone].    Sincerely,      Learta Codding, MD,FACC  Electronically Signed    GED/MedQ  DD: 02/17/2007  DT: 02/17/2007  Job #: [id number]

## 2010-12-25 NOTE — Assessment & Plan Note (Signed)
OFFICE VISIT   [name] [name], [name] [name]  DOB:  1961/03/01                                        July 10, 2009  CHART #:  [id number]   The patient comes in today for postoperative followup.  She is status  post left VATS for resection of a pericardial cyst on June 26, 2009.  Her postoperative course was uneventful and she was able to be  discharged home on June 29, 2009 without difficulty.  Since her  discharge, she has been progressing well.  She is still 4 at times and  taking the pain medication once or twice a day, but otherwise is greatly  improved.  She has no specific complaints.   On physical exam, blood pressure 105/64, pulse is 64, respirations 16,  and O2 sat 96% on room air.  Her left thoracotomy incision has healed  well.  Lungs are clear.  Heart is regular rate and rhythm without  murmurs, rubs, or gallops.   Chest x-ray is stable with improving atelectasis.   ASSESSMENT/PLAN:  The patient is progressing nicely status post left  video-assisted thoracoscopic surgery and resection of pericardial cyst.  At this point, we have released her to begin driving and resume her  regular activity.  She will return for followup on an as needed basis.  She will also follow up as directed with Dr. Andee Lineman.   Kerin Perna, [name].D.  Electronically Signed   GC/MEDQ  D:  07/10/2009  T:  07/11/2009  Job:  [id number]   cc:   Learta Codding, MD,FACC

## 2010-12-25 NOTE — Cardiovascular Report (Signed)
NAME:  [name] [name], [name] [name]                 ACCOUNT NO.:  [id number]   MEDICAL RECORD NO.:  [id number]          PATIENT TYPE:  OIB   LOCATION:  1961                         FACILITY:  [facility]   PHYSICIAN:  Bruce R. Juanda Chance, MD, FACCDATE OF BIRTH:  04-09-1961   DATE OF PROCEDURE:  01/30/2007  DATE OF DISCHARGE:                            CARDIAC CATHETERIZATION   HISTORY:  [name] is 50 years old and was recently admitted to  [facility] on the hospitalist service with chest pain.  She had a  Myoview scan which showed an apical defect that was thought to most  probably represent attenuation, but because of this and persistent  symptoms and positive risk factors, she is scheduled for evaluation with  catheterization.  She also has a lung density there was noted on x-ray.   PROCEDURE:  The procedure was performed by the femoral arterial sheath  and 4-French preformed coronary catheters.  A front wall arterial  puncture was performed Omnipaque contrast was used.  After completion,  the patient tolerated and left the laboratory in satisfactory condition.   RESULTS:  The aortic pressure was 127/71 with a mean of 92 and left  ventricular pressure was 127/10.   Left main coronary:  The left main coronary is a very short vessel, free  of disease.   Left anterior descending artery gave rise to two diagonal branches and  two septal perforators.  There was an eccentric plaque in the proximal  LAD just after the second diagonal branch which appeared to narrow the  lumen about 50%.  There was no calcification.   The circumflex artery gave rise to two marginal branches and two  posterolateral branches.  These vessels were free of significant  disease.   The right coronary artery was a moderate-sized vessel, gave rise to a  clonus branch and right ventricular branch, posterior descending branch  and two posterolateral branches.  These vessels were free of disease.   The left ventriculogram and  was done in the RAO projection, showed good  wall motion with no areas of hypokinesis.  The estimated ejection  fraction 60%.   CONCLUSION:  Probable nonobstructive coronary artery disease with 50%  narrowing in the proximal left anterior descending,  no significant  obstruction in the circumflex and right coronary and normal left  ventricular function.   RECOMMENDATIONS:  The patient has a plaque of about 50% in the LAD.  All  the diameter narrowing is not severe.  The reference vessel lumen is  only about 2 mm.  This makes the lumen relatively small.  I think this  is probably nonobstructive but I cannot be 100% sure of this.  Her scan  showed apical defect that was thought mostly to be attenuation.  It was  thought probably to be attenuation rather than ischemia and his symptoms  have been  thought to be atypical.  I discussed the findings with Dr. Andee Lineman and  will plan to start her on a calcium channel blocker.  If her CT has not  been scheduled, will schedule that to evaluate her lung lesion  with  followup then with Dr. Andee Lineman.      Bruce Elvera Lennox Juanda Chance, MD, [facility]  Electronically Signed     BRB/MEDQ  D:  01/30/2007  T:  01/30/2007  Job:  [id number]   cc:   Learta Codding, MD,FACC  Cardiac Cath Lab

## 2010-12-25 NOTE — Assessment & Plan Note (Signed)
[facility]                          [facility] OFFICE NOTE   NAME:[name], [name] LEAMAN            MRN:          [id number]  DATE:02/17/2007                            DOB:          1960/11/15    I have talked today with Ms. Krystal Teachey regarding the results of her CT  scan.  This patient underwent a recent cardiac catheterization for  atypical chest pain after an equivocal Cardiolite stress study was  obtained.  She did have a 50% LAD lesion which was felt by Dr. Juanda Chance  not to be flow limiting.  It also did not correlate in particular with  her Cardiolite findings.  She was started however on a calcium channel  blocker.  In the interim she has done well.  During the pre-cath workup  we also found that she had some haziness on her chest x-ray along the  left heart border.  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   I called the patient about these results.  She appears to have no  symptoms related to this finding.  I have also explained to patient that  typically these pericardial cysts are benign.  There appears to be no  external compression to the heart.  Of note is however the patient does  have a history of breast lump and a family history of lung cancer.  I  told her that although I suspect this may be benign, I feel very strong  that she should be further evaluated for this and recommended an  evaluation by Cardiovascular Thoracic surgeons at [facility].  I also  think an MRI might be helpful to further define this structure.  In any  event I felt very strongly this patient needs followup for this, and  also documenting my prior note, she will need followup for her breast  lump.   The patient states that due to financial constraints she wants to wait a  few weeks before going to the CVTS appointment.  We will  make  arrangements for her to be seen soon however.  She will also come by the  office tomorrow to pick up a letter to clear her for work at [employer].  I told her that I do not see any clear medical contraindication for her  to continue her daily work in the interim.     Learta Codding, MD,FACC  Electronically Signed    GED/MedQ  DD: 02/17/2007  DT: 02/17/2007  Job #: [id number]

## 2010-12-25 NOTE — Assessment & Plan Note (Signed)
OFFICE VISIT   [place], [name] [name]  DOB:  [birth year]                                        November 13, 2007  CHART #:  [id number]   CURRENT PROBLEMS:  1. 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  2. Atypical chest pain with normal catheterization.  3. Anxiety disorder.   HISTORY OF PRESENT ILLNESS:  [name] [name] is a 50 year old female with  atypical chest pain and anxiety disorder who was found to have a  pericardial cyst as an incidental finding on CT scans to rule out  pulmonary embolus.  She has been through many evaluations for her chest  pain, including swallow studies recently.  She is now on Zocor and  Ranitidine, in addition to her previous long-term meds including  aspirin, clonazepam, Norvasc and HyoMax SL.  She states she still has  atypical chest pain; however, also, this is easily associated with  severe nausea, vomiting and her abdominal discomfort as well.  When she  locates the chest pain, it is generalized to the anterior chest and  epigastrium and does not localize laterally where this small pericardial  cyst is located.   PHYSICAL EXAMINATION:  VITAL SIGNS:  Blood pressure 109/70, pulse 74,  respirations 18, stabilization 99%.  GENERAL:  She is anxious but alert and comfortable.  LUNGS:  Breath sounds are clear.  NECK:  She has no cervical adenopathy.  CARDIOVASCULAR: Cardiac rhythm is regular without friction rub or  gallop.  EXTREMITIES:  Warm with pulses.  NEUROLOGIC:  Intact.   LABORATORY DATA:  PA and lateral chest x-rays show a small density on  the left pericardial silhouette, which is stable from her previous x-  rays.   IMPRESSION AND PLAN:  The patient has a small pericardial cyst, which  has been stable since its initial diagnosis in 2008.  I do not believe  it is related to her chest pain.  I told [name] [name] that an operation to  remove the cyst would entail a small thoracotomy with  significant  associated pain, would take at least 3 months from which to recover.  I  recommended we continue to follow this finding on x-ray, which appears  to be an incidental benign pericardial cyst.  She will return with a  followup MRI of the chest in 6 months to try to avoid prolonged  radiation exposure.  All questions were answered and she agrees.   Kerin Perna, M.D.  Electronically Signed   PV/MEDQ  D:  11/13/2007  T:  11/13/2007  Job:  [id number]   cc:   Learta Codding, MD,FACC

## 2010-12-25 NOTE — Assessment & Plan Note (Signed)
OFFICE VISIT   [name] [name], MAALLE STARRETT  DOB:  04/09/1961                                        June 05, 2007  CHART #:  [id number]   CLINICAL PROBLEMS:  1. Left-sided pericardial cyst noted on CT scan June 2008.  2. Atypical chest pain.  3. Anxiety disorder.   HISTORY OF PRESENT ILLNESS:  [name] [name] returns for further followup  and discussion of a 3 cm x 1 cm pericardial cyst noted on CT scan  earlier this summer.  At that time, she had atypical chest pain and  underwent cardiac catheterization, which ruled out coronary disease, and  a CT scan of the chest which ruled out lung disease or pulmonary  embolus.  The patient was recently evaluated in the emergency department  with symptoms of bronchitis, and a CT scan of the sinuses was performed.  She is currently finishing a course of oral antibiotics for bronchitis.  She still has occasional left sternal chest pain not related to activity  or time of day, and some difficulty swallowing.   A cardiac MRI was performed recently to follow up the initial CT scan  diagnosis of pericardial cyst.  This demonstrated a pericardial cyst on  the left lateral aspect of the heart measuring 3 cm x 1 cm.  There is no  evidence of invasion or any malignant characteristics.   PHYSICAL EXAMINATION:  Blood pressure 110/70.  Pulse 70.  Respirations  18.  Saturation 99%.  She is anxious, but alert and responsive.  Breath sounds are clear and equal.  CARDIAC:  Rhythm is regular.  There is no gallop or murmur.  Peripheral pulses are intact.  There is no adenopathy.   IMPRESSION AND PLAN:  Ms. [name] [name] has a small left-sided pericardial cyst,  which is an incidental finding on prior CT scans.  There has been no  significant change in the size of this cyst comparing the CT scan of  June to the more recent cardiac MRI in October, taking in to account  difference in technique.  The patient does have a history of lung  cancer  in her mother, and she is very anxious about developing lung cancer  herself, even though she stopped smoking 3 years ago.   I feel the best course at this time is reassurance and careful followup.  I would not recommend surgery at this point, since she is being treated  for an upper respiratory infection, and she is prone to respiratory  infections in the winter months.  I plan on seeing her back in the  spring with a chest x-ray to revisit the situation, and to assess the  degree of anxiety and possible chest pain related to this diagnosis of  pericardial cyst.   Kerin Perna, M.D.  Electronically Signed   PV/MEDQ  D:  06/05/2007  T:  06/06/2007  Job:  [id number]   cc:   Learta Codding, MD,FACC  [facility] Office

## 2010-12-25 NOTE — Assessment & Plan Note (Signed)
OFFICE VISIT   [name], [name] M  DOB:  Dec 21, 1960                                        Dec 26, 2008  CHART #:  [id number]   CURRENT PROBLEMS:  1. Stable 3 x 1.4-cm pericardial cyst followed with annual scans since      June 2008.  2. Atypical chest pain with prior normal cardiac cath in June 2008.  3. Anxiety disorder.  4. Past history of smoking, reformed x2 years.   PRESENT ILLNESS:  The patient is a 50 year old Caucasian female, who has  been followed for a pericardial cyst which was an incidental finding on  chest x-ray and CT scan when she was being evaluated for chest pain and  then underwent a cardiac catheterization in the summer of 2008.  She had  a scan of her mediastinum in October.   Kerin Perna, M.D.    PV/MEDQ  D:  12/26/2008  T:  12/27/2008  Job:  [id number]   cc:   Learta Codding, MD,FACC

## 2010-12-25 NOTE — Assessment & Plan Note (Signed)
[facility]                          [facility]ICE NOTE   NAME:[name], [name] NOTTE            MRN:          [id number]  DATE:02/05/2007                            DOB:          1961-04-04    PRIMARY CARDIOLOGIST:  Dr. Lewayne Bunting.   REASON FOR VISIT:  [name] presents to the office today as an add on  for evaluation of right groin pain. She was recently referred for  cardiac catheterization, per Dr. Andee Lineman, which was performed on June  20th by Dr. Charlies Constable. She is scheduled to return to the clinic for  formal follow up on July 16, by which time she will have had a  recommended follow up CT scan on July 2, for evaluation of a recent  abnormal chest x-ray suggestive of a possible left lung density.   The patient presented to the office complaining of right groin pain and  is now being evaluated for this. She states that there has been some  discomfort since undergoing the procedure six days ago, but that it was  particularly worse last evening. However, she notes no associated  swelling or bleeding.   On examination, the right groin is soft with no evidence of hematoma and  with only some minimal peri-incisional ecchymosis which extends down to  the medial aspect of the right thigh. Both the femoral and the posterior  tibialis pulses are palpable, and there is no femoral bruit.   The patient was reassured with respect to her right groin discomfort and  conservative measures, including the use of over-the-counter non-  steroidals, was recommended. She is to closely monitor for any worsening  symptoms and to contact our office in the interim. She is to otherwise  keep her scheduled follow up with [place] in the next few weeks for further  review of her recent catheterization findings, as well as the findings  of the recommended follow up CT scan.      Gene Serpe, PA-C  Electronically Signed      Learta Codding, MD,FACC  Electronically Signed   GS/MedQ  DD: 02/05/2007  DT: 02/06/2007  Job #: [id number]

## 2010-12-25 NOTE — Assessment & Plan Note (Signed)
OFFICE VISIT   Eugene, Jaton M  DOB:  July 08, 1961                                        Dec 26, 2008  CHART #:  [id number]   CURRENT PROBLEMS:  1. Pericardial cyst 3 x 1.4 cm and stable since 2008.  2. Atypical chest pain with normal coronaries on cardiac cath in June      2008.  3. Anxiety disorder.   PRESENT ILLNESS:  The patient is a 50 year old Caucasian female, ex-  smoker, who I followed since 2008 when a pericardial cyst was noted as  an incidental finding on a CT scan of the chest when she was being  evaluated for chest pain.  At that time coronary arteriography by Dr.  Charlies Constable demonstrated normal coronaries.  A followup MRI performed  in late 2008 confirmed this to be a pericardial cyst.  She now returns  for followup cardiac MRI to study the pericardial cyst.  Since her last  office visit 1 year ago, she has had persistent pain in her chest, but  this seems mainly related to eating and swallowing and is positional,  and sounds like it very well maybe a esophageal spasm.  She states she  has had barium swallows and that has shown no obstruction.  I would be  more concerned about a functional swallowing disorder.  She has had some  weight loss due to these eating problems.  She denies productive cough  or hemoptysis.  She is a nonsmoker currently, has not smoked for over 2  years.  She has palpitations and also has some right arm numbness.  There is a family history of lung cancer.   MEDICATIONS:  Aspirin, Norvasc 5 mg, nitroglycerin p.r.n., Zocor 20 mg,  and Prozac.   PHYSICAL EXAMINATION:  VITAL SIGNS:  Blood pressure 140/80, pulse 66 and  regular, respirations 18, and saturation 99% on room air.  GENERAL:  She is alert and oriented, but somewhat anxious.  LUNGS:  Breath sounds are clear and equal.  NECK:  She has no palpable adenopathy in her neck or supraclavicular  fossa.  CARDIAC:  Rhythm is regular without S3 gallop or murmur.   Peripheral  pulses are intact.  She has no chest wall tenderness.   LABORATORY DATA:  Her cardiac MRI is reviewed with the patient and it  shows a stable pericardial cyst measuring the same dimension 3 x 1.4 cm  on the lateral aspect of the pericardium.   IMPRESSION AND PLAN:  The patient's symptoms and weight loss and  difficulty swallowing are not related to the pericardial cyst.  Surgery  to remove this would potentially only result in different types of post-  thoracotomy pain and I would recommend following this with serial scans.  I will be comfortable performing a plain chest x-ray in 6 months and  hold off an MRI until she has better insurance coverage to help her with  the expenses.  I would recommend a consideration of a GI evaluation for  endoscopy.   Kerin Perna, M.D.  Electronically Signed   PV/MEDQ  D:  12/26/2008  T:  12/27/2008  Job:  [id number]   cc:   Learta Codding, MD,FACC

## 2010-12-25 NOTE — Assessment & Plan Note (Signed)
[facility]                          [facility]ICE NOTE   NAME:[name], [name] WARRIOR                        MRN:          [id number]  DATE:03/06/2009                            DOB:          1960/08/18    REFERRING PHYSICIAN:  [facility]   HISTORY OF PRESENT ILLNESS:  The patient is a 50 year old female, ex-  smoker, with history of nonobstructive coronary artery disease.  The  patient has a 3 x 7 x 1.4 cm pericardial cyst which is followed by Dr.  Kathlee Nations Tright.  She is asymptomatic from a cardiac perspective.  She  denies any shortness of breath, orthopnea, or PND.  She had a recent MRI  done which showed normal ejection fraction and essentially a cyst that  was unchanged in size.  Cyst is located at the left lateral wall.  The  patient also had a stress echocardiogram in 2009, which was within  normal limits.  She had a catheterization in 2008, which showed a 50%  proximal LAD lesion.  She has no exertional chest pain.  She does have  sharp pains occasionally with esophageal spasm and dysphagia.  She had a  barium swallow done, which was within normal limits.  She states that  her symptoms have improved, but once in awhile she will still have  dysphagia and odynophagia, but this is not continuous.  The patient  states that her working conditions are rather poor.  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  Unfortunately, work environment is not air-conditioned.   MEDICATIONS:  1. Aspirin 81 mg p.o. daily.  2. Norvasc 5 mg p.o. daily.  3. Multivitamin daily.  4. Simvastatin 20 mg p.o. nightly.  5. Fluoxetine 20 mg p.o. daily.   REVIEW OF SYSTEMS:  The patient still has episodes of depression and  feeling down and blue.  She has been for a long time on fluoxetine, but  never had an increase in her dosing.   PHYSICAL EXAMINATION:  VITAL SIGNS:  Blood pressure 106/67, heart rate  64,  weight 120 pounds.  NECK:  Normal carotid upstroke and no carotid bruits.  LUNGS:  Clear breath sounds bilaterally.  HEART:  Regular rate and rhythm.  Normal S1 and S2.  No murmur, rubs or  gallops.  ABDOMEN:  Soft, nontender.  No rebound or guarding.  Good bowel sounds.  EXTREMITIES:  No cyanosis, clubbing or edema.  NEURO:  The patient is alert, oriented and grossly nonfocal.   PROBLEM LIST:  1. Atypical chest pain.  2. Nonobstructive coronary artery disease by catheterization in June      2008 with 50% proximal left anterior descending lesion.  3. Nonischemic stress echocardiogram in March 2009.  4. Normal left ventricular function.  5. Pericardial cyst.  Measurements, see above.  Followup by Dr. Morton Peters on a yearly basis.  6. Gastroesophageal reflux disease with dysphagia and odynophagia with      negative barium swallow.  7. Tobacco use.  8. Family history of lung cancer.   PLAN:  1. I told the patient that she can take p.r.n. nitroglycerin for      esophageal spasm, which is likely her problem.  However, I have      told her that if she has chest pain on exertion, we would likely      reevaluate her with a stress test.  2. Pericardial cyst followed by Dr. Morton Peters and there has been no      change in measurements.  3. The patient still has significant symptoms consistent with      depression and I increased her fluoxetine to 40 mg a day and      further refills that can be obtained per her primary care      physician.     Learta Codding, MD,FACC  Electronically Signed    GED/MedQ  DD: 03/06/2009  DT: 03/07/2009  Job #: [id number]   cc:   Health Department

## 2010-12-28 IMAGING — CR DG CHEST 2V
2 series · 2 of 2 positions shown · non-contrast
Comparison: Chest x-ray of 11/13/2007 and MRI of the chest of
12/21/2008

CLINICAL DATA: Pericardial cyst, chest tightness, shortness of
breath

CHEST - 2 VIEW

[w chest pa]
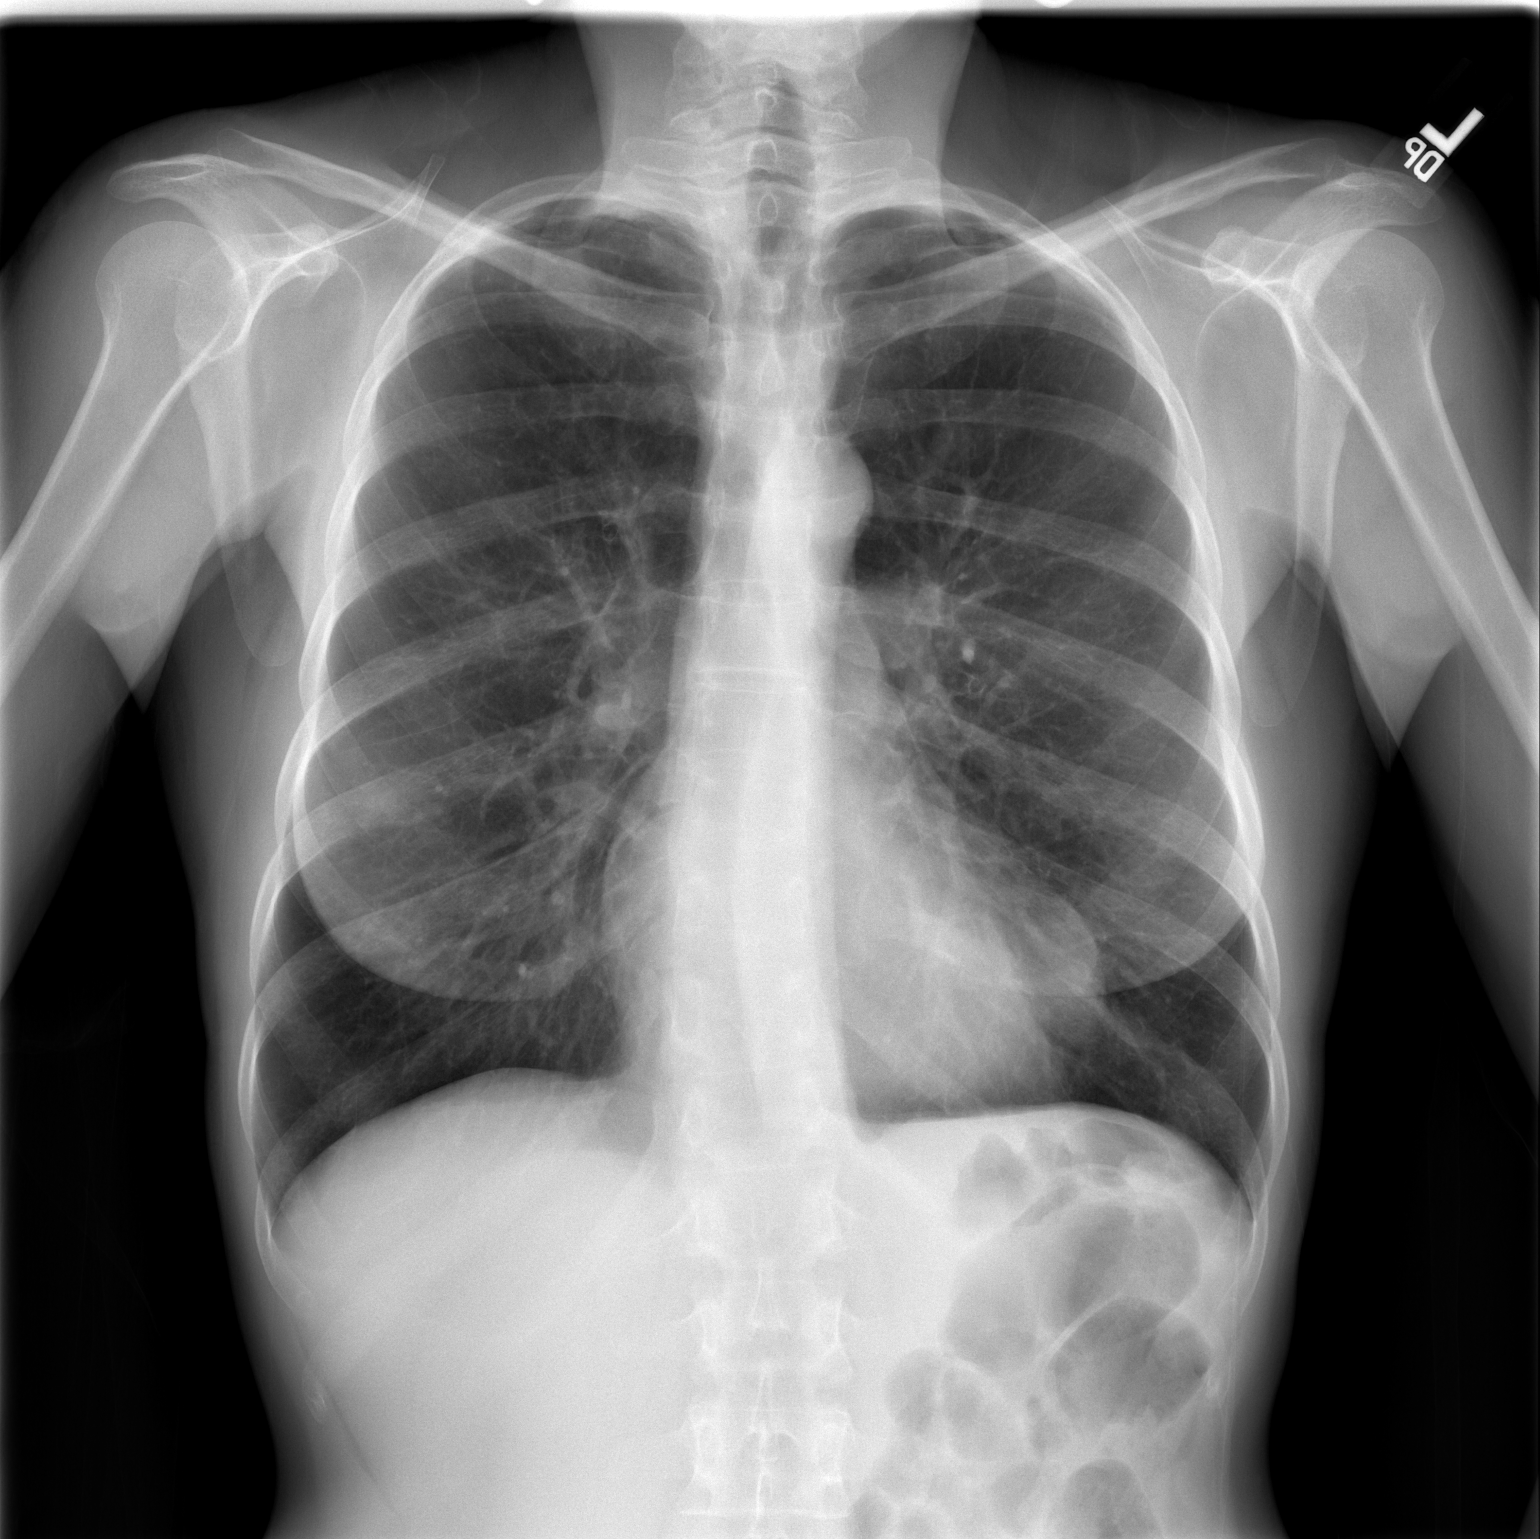

[w chest lat]
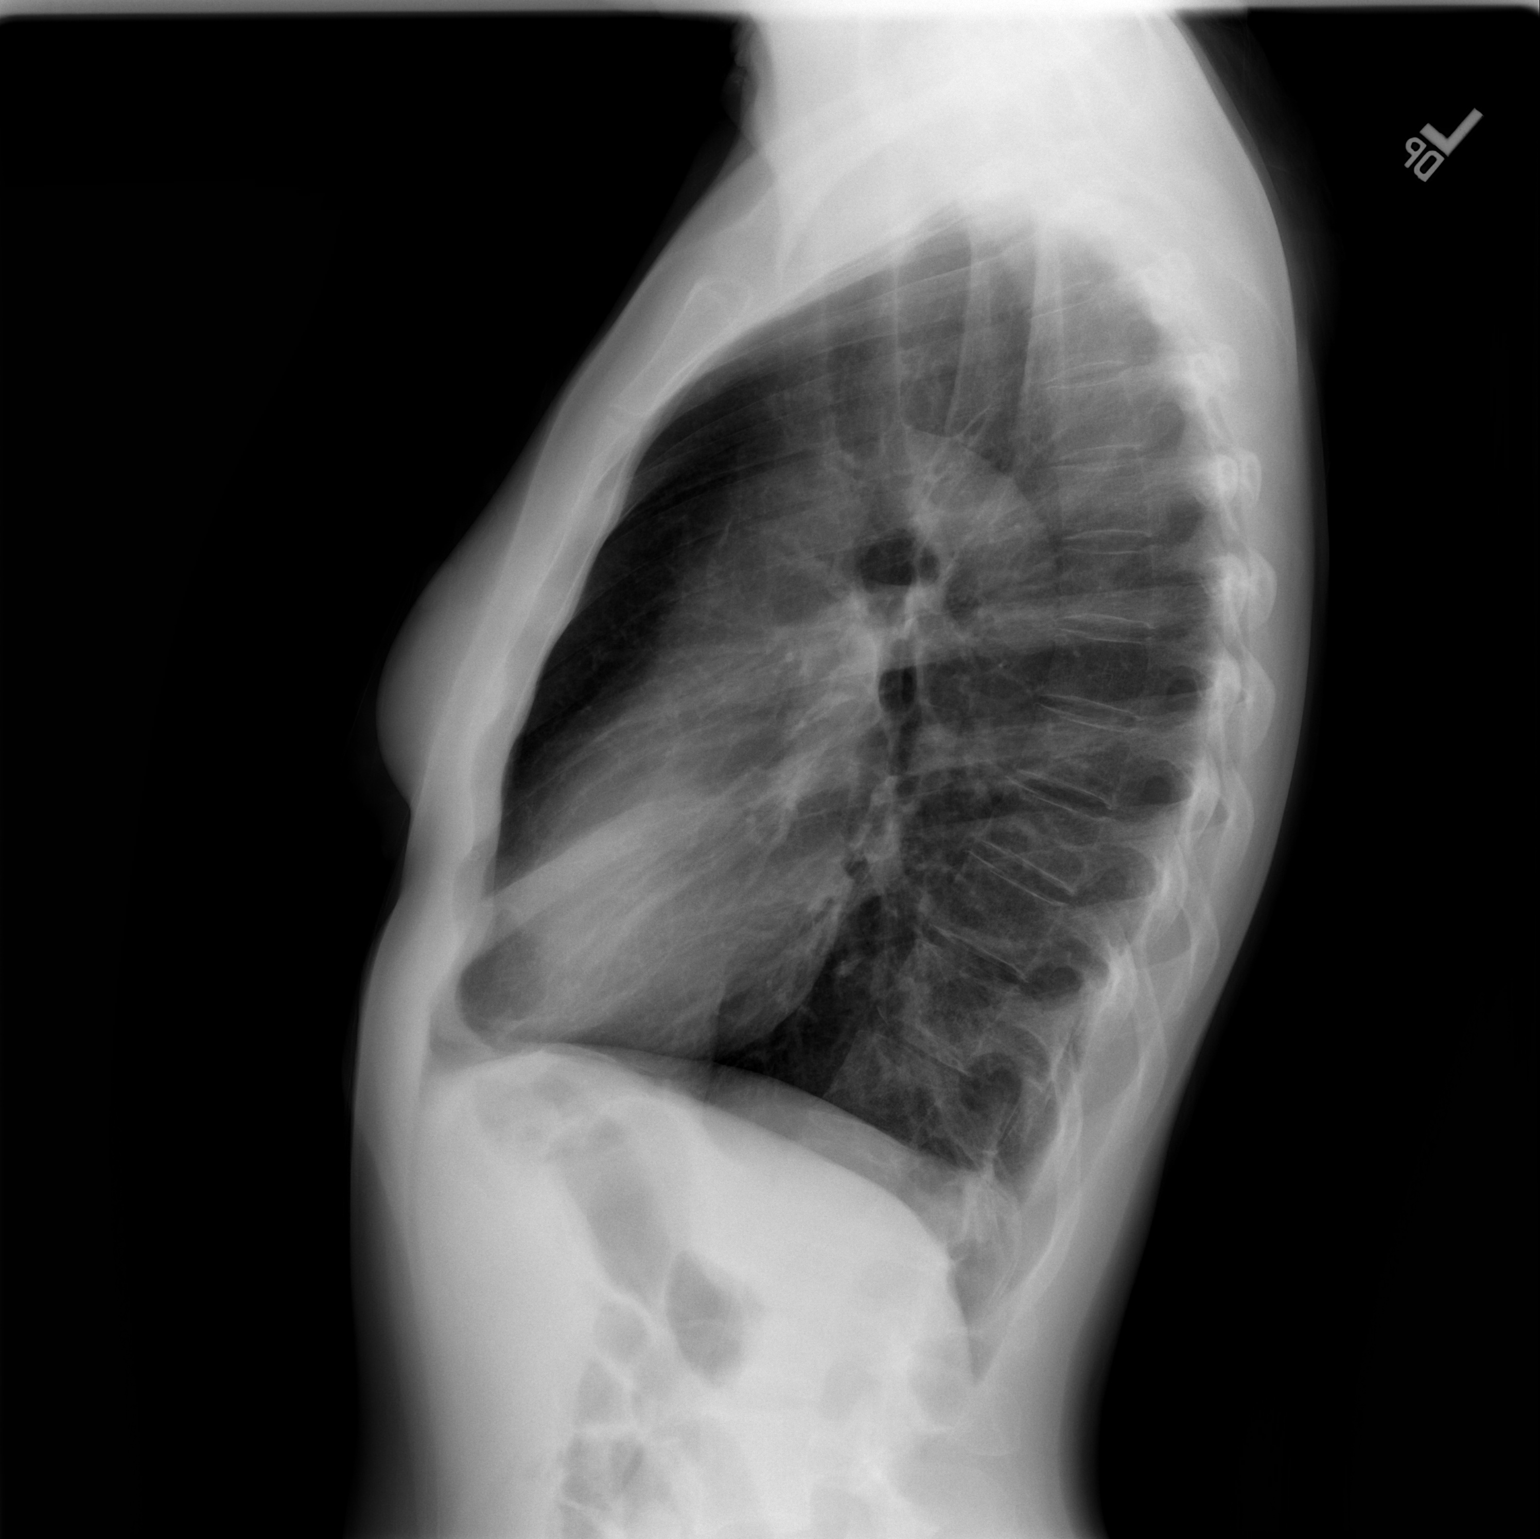

[2 of 2 positions shown; findings below may reference images not displayed]

FINDINGS: The cystic structure noted by MR of the chest along the
left heart border is unchanged by chest x-ray, and by MRI is most
consistent with a pericardial cyst.  No active infiltrate or
effusion is seen.  The heart is within normal limits in size.  No
bony abnormality is seen.
IMPRESSION: No change in probable pericardial cyst along the left heart border.
No active lung disease.

## 2011-05-08 DIAGNOSIS — I2510 Atherosclerotic heart disease of native coronary artery without angina pectoris: Secondary | ICD-10-CM

## 2011-05-23 LAB — POCT I-STAT CREATININE
Creatinine, Ser: 0.8
Operator id: 288831

## 2011-05-23 LAB — CBC
HCT: 39.4
Hemoglobin: 13.6
MCHC: 34.4
MCV: 96.5
Platelets: 369
RBC: 4.08
RDW: 13.1
WBC: 6.9

## 2011-05-23 LAB — DIFFERENTIAL
Basophils Absolute: 0
Basophils Relative: 1
Eosinophils Absolute: 0.2
Eosinophils Relative: 3
Lymphocytes Relative: 33
Lymphs Abs: 2.3
Monocytes Absolute: 0.6
Monocytes Relative: 9
Neutro Abs: 3.8
Neutrophils Relative %: 55

## 2011-05-23 LAB — POCT CARDIAC MARKERS
CKMB, poc: 3
CKMB, poc: <1 — ABNORMAL LOW
Myoglobin, poc: 199
Myoglobin, poc: 51.7
Operator id: 288831
Troponin i, poc: <0.05

## 2011-05-23 LAB — I-STAT 8, (EC8 V) (CONVERTED LAB)
BUN: 4 — ABNORMAL LOW
Chloride: 104
Glucose, Bld: 78
HCT: 43
Hemoglobin: 14.6
Operator id: 288831
pCO2, Ven: 58.2 — ABNORMAL HIGH

## 2012-01-14 DIAGNOSIS — R0602 Shortness of breath: Secondary | ICD-10-CM

## 2012-03-06 ENCOUNTER — Encounter: Payer: Self-pay | Admitting: *Deleted

## 2012-07-20 DIAGNOSIS — R112 Nausea with vomiting, unspecified: Secondary | ICD-10-CM

## 2015-04-11 ENCOUNTER — Encounter: Payer: Self-pay | Admitting: *Deleted

## 2015-04-11 ENCOUNTER — Ambulatory Visit (INDEPENDENT_AMBULATORY_CARE_PROVIDER_SITE_OTHER): Payer: 59 | Admitting: Cardiology

## 2015-04-11 ENCOUNTER — Encounter: Payer: Self-pay | Admitting: Cardiology

## 2015-04-11 VITALS — BP 106/72 | HR 92 | Ht 64.0 in | Wt 144.8 lb

## 2015-04-11 DIAGNOSIS — I2510 Atherosclerotic heart disease of native coronary artery without angina pectoris: Secondary | ICD-10-CM

## 2015-04-11 DIAGNOSIS — Z9889 Other specified postprocedural states: Secondary | ICD-10-CM | POA: Diagnosis not present

## 2015-04-11 DIAGNOSIS — Z8774 Personal history of (corrected) congenital malformations of heart and circulatory system: Secondary | ICD-10-CM

## 2015-04-11 NOTE — Patient Instructions (Signed)
Your physician recommends that you continue on your current medications as directed. Please refer to the Current Medication list given to you today. Your physician has requested that you have an echocardiogram. Echocardiography is a painless test that uses sound waves to create images of your heart. It provides your doctor with information about the size and shape of your heart and how well your heart's chambers and valves are working. This procedure takes approximately one hour. There are no restrictions for this procedure. Your physician recommends that you schedule a follow-up appointment in: 1 year. You will receive a reminder letter in the mail in about 10 months reminding you to call and schedule your appointment. If you don't receive this letter, please contact our office. 

## 2015-04-11 NOTE — Progress Notes (Signed)
Cardiology Office Note  Date: 04/11/2015   ID: Adison, Reifsteck 1961/04/15, MRN [id number]  PCP: Kirstie Peri, MD  Consulting Cardiologist: Nona Dell, MD   Chief Complaint  Patient presents with  . Cardiac follow-up    History of Present Illness: [name] [name] is a 53 y.o. female referred for cardiology consultation by Dr. Sherryll Burger. She is a former patient of Dr. Andee Lineman last seen in 2011. I reviewed her chart and updated her cardiac history which is outlined below. She does not endorse any recurring chest pain symptoms at this point. 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 She and her boyfriend enjoy walking for exercise, she also [place] a local lake. She reports NYHA class II dyspnea, no palpitations.  Follow-up ECG today is normal.  We discussed her prior cardiac history including nonobstructive coronary atherosclerosis and previous resection of a small pericardial cyst. She has not had a follow-up echocardiogram in the last several years.  Lab work is followed by [facility]. We are requesting her most recent results.   Past Medical History  Diagnosis Date  . CAD (coronary artery disease)     Nonobstructive 2008   . Pericardial cyst     Resected 2010   . GERD (gastroesophageal reflux disease)   . Irritable bowel syndrome   . Anxiety and depression   . Recurrent chest pain   . Migraine headache     Past Surgical History  Procedure Laterality Date  . Partial hysterectomy    . Left vat with resection of pericardial cyst  November 2010    [name]    Current Outpatient Prescriptions  Medication Sig Dispense Refill  . acetaminophen (TYLENOL) 325 MG tablet Take 325 mg by mouth as needed.    . calcium carbonate (OS-CAL) 600 MG TABS tablet Take 600 mg by mouth daily.    Marland Kitchen ibandronate (BONIVA) 150 MG tablet Take 150 mg by mouth every 30 (thirty) days. Take in the morning with a full glass of water, on an empty stomach, and do not take  anything else by mouth or lie down for the next 30 min.    Boris Lown Oil 1000 MG CAPS Take 1 capsule by mouth daily.    . vitamin B-12 (CYANOCOBALAMIN) 1000 MCG tablet Take 1,000 mcg by mouth daily.     No current facility-administered medications for this visit.    Allergies:  Penicillins; Sulfamethoxazole-trimethoprim; and Tramadol hcl   Social History: The patient  reports that she quit smoking about 3 years ago. Her smoking use included Cigarettes. She does not have any smokeless tobacco history on file. She reports that she does not drink alcohol or use illicit drugs.   Family History: The patient's family history includes COPD in her mother; Hypertension in her mother; Lung cancer in her mother; Transient ischemic attack in her mother.   ROS:  Please see the history of present illness. Otherwise, complete review of systems is positive for migraine headaches.  All other systems are reviewed and negative.   Physical Exam: VS:  BP 106/72 mmHg  Pulse 92  Ht  (1.626 m)  Wt 144 lb 12.8 oz (65.681 kg)  BMI 24.84 kg/m2  SpO2 96%, BMI Body mass index is 24.84 kg/(m^2).  Wt Readings from Last 3 Encounters:  04/11/15 144 lb 12.8 oz (65.681 kg)  07/26/10 108 lb (48.988 kg)  06/12/10 117 lb (53.071 kg)     General: Patient appears comfortable  at rest. HEENT: Conjunctiva and lids normal, oropharynx clear. Neck: Supple, no elevated JVP or carotid bruits, no thyromegaly. Lungs: Clear to auscultation, nonlabored breathing at rest. Cardiac: Regular rate and rhythm, no S3 or significant systolic murmur, no pericardial rub. Abdomen: Soft, nontender, bowel sounds present, no guarding or rebound. Extremities: No pitting edema, distal pulses 2+. Skin: Warm and dry. Musculoskeletal: No kyphosis. Neuropsychiatric: Alert and oriented x3, affect grossly appropriate.   ECG: ECG is ordered today.   Assessment and Plan:  1. History of nonobstructive coronary atherosclerosis by prior  assessment 2008, no active angina symptoms, and ECG is normal today. Current medications reviewed. Would consider baby aspirin daily unless there are other specific contraindications. Also plan to request most recent lab work to see how her lipid status has been. In the past she had an equivocal noninvasive stress test which led to other invasive evaluations. Since she is asymptomatic, would hold off on ischemic evaluation at this time and focus more on risk factor modification. I encouraged her to continue a regular exercise plan and let us know if she has any change in functional capacity.  2. History of small pericardial cyst status post resection in 2010. We will obtain a follow-up echocardiogram.  Current medicines were reviewed with the patient today.   Orders Placed This Encounter  Procedures  . EKG 12-Lead    Disposition: FU with me in 1 year.   Signed, [name], MD, [facility] 04/11/2015 10:30 AM    [facility] at [facility] [address] [place], [place], [place] [address] Phone: [phone]; Fax: [phone]

## 2015-04-13 ENCOUNTER — Telehealth: Payer: Self-pay | Admitting: *Deleted

## 2015-04-13 ENCOUNTER — Ambulatory Visit (INDEPENDENT_AMBULATORY_CARE_PROVIDER_SITE_OTHER): Payer: 59

## 2015-04-13 ENCOUNTER — Other Ambulatory Visit: Payer: Self-pay

## 2015-04-13 DIAGNOSIS — I2510 Atherosclerotic heart disease of native coronary artery without angina pectoris: Secondary | ICD-10-CM | POA: Diagnosis not present

## 2015-04-13 NOTE — Telephone Encounter (Signed)
Patient informed. 

## 2015-04-13 NOTE — Telephone Encounter (Signed)
-----   Message from [name], MD sent at 04/13/2015  3:49 PM EDT ----- Reviewed. Please let her know that echocardiogram looks good overall, LVEF is normal, no major valvular abnormalities.

## 2016-05-22 ENCOUNTER — Encounter: Payer: Self-pay | Admitting: *Deleted

## 2016-05-23 ENCOUNTER — Ambulatory Visit (INDEPENDENT_AMBULATORY_CARE_PROVIDER_SITE_OTHER): Payer: BLUE CROSS/BLUE SHIELD | Admitting: Cardiology

## 2016-05-23 ENCOUNTER — Encounter: Payer: Self-pay | Admitting: Cardiology

## 2016-05-23 VITALS — BP 111/70 | HR 79 | Ht 64.0 in | Wt 143.8 lb

## 2016-05-23 DIAGNOSIS — I2510 Atherosclerotic heart disease of native coronary artery without angina pectoris: Secondary | ICD-10-CM

## 2016-05-23 DIAGNOSIS — Z8774 Personal history of (corrected) congenital malformations of heart and circulatory system: Secondary | ICD-10-CM

## 2016-05-23 DIAGNOSIS — Z9889 Other specified postprocedural states: Secondary | ICD-10-CM | POA: Diagnosis not present

## 2016-05-23 NOTE — Progress Notes (Signed)
Cardiology Office Note  Date: 05/23/2016   ID: [name] [name], DOB 1961-05-09, MRN [id number]  PCP: Wendall Papa  Primary Cardiologist: Nona Dell, MD   Chief Complaint  Patient presents with  . Coronary Artery Disease    History of Present Illness: [name] [name] is a 55 y.o. female last seen in August 2016. She presents for a routine follow-up visit. Reports no significant interval change other than having the flu earlier this year. She does not report any chest pain or palpitations. 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  Follow-up echocardiogram from last year is outlined below. We discussed the results. I reviewed her follow-up ECG today which shows normal sinus rhythm with small R' in lead V1 and V2.  Past Medical History:  Diagnosis Date  . Anxiety and depression   . CAD (coronary artery disease)    Nonobstructive 2008   . GERD (gastroesophageal reflux disease)   . Irritable bowel syndrome   . Migraine headache   . Pericardial cyst    Resected 2010   . Recurrent chest pain     Past Surgical History:  Procedure Laterality Date  . Left VAT with resection of pericardial cyst  November 2010   [name]  . PARTIAL HYSTERECTOMY      Current Outpatient Prescriptions  Medication Sig Dispense Refill  . acetaminophen (TYLENOL) 325 MG tablet Take 325 mg by mouth as needed.    . Albuterol Sulfate 108 (90 Base) MCG/ACT AEPB Inhale into the lungs as needed.    . calcium carbonate (OS-CAL) 600 MG TABS tablet Take 600 mg by mouth daily.    Marland Kitchen ibandronate (BONIVA) 150 MG tablet Take 150 mg by mouth every 30 (thirty) days. Take in the morning with a full glass of water, on an empty stomach, and do not take anything else by mouth or lie down for the next 30 min.    Boris Lown Oil 1000 MG CAPS Take 1 capsule by mouth daily.    . SUMAtriptan (IMITREX) 50 MG tablet Take 50 mg by mouth as needed for migraine. May repeat in 2  hours if headache persists or recurs.    . vitamin B-12 (CYANOCOBALAMIN) 1000 MCG tablet Take 1,000 mcg by mouth daily.     No current facility-administered medications for this visit.    Allergies:  Ciprofloxacin; Penicillins; Sulfamethoxazole-trimethoprim; and Tramadol hcl   Social History: The patient  reports that she quit smoking about 4 years ago. Her smoking use included Cigarettes. She has never used smokeless tobacco. She reports that she does not drink alcohol or use drugs.   ROS:  Please see the history of present illness. Otherwise, complete review of systems is positive for none.  All other systems are reviewed and negative.   Physical Exam: VS:  BP 111/70   Pulse 79   Ht 5\' 4"  (1.626 m)   Wt 143 lb 12.8 oz (65.2 kg)   SpO2 96%   BMI 24.68 kg/m , BMI Body mass index is 24.68 kg/m.  Wt Readings from Last 3 Encounters:  05/23/16 143 lb 12.8 oz (65.2 kg)  04/11/15 144 lb 12.8 oz (65.7 kg)  07/26/10 108 lb (49 kg)    General: Patient appears comfortable at rest. HEENT: Conjunctiva and lids normal, oropharynx clear. Neck: Supple, no elevated JVP or carotid bruits, no thyromegaly. Lungs: Clear to auscultation, nonlabored breathing at rest. Cardiac: Regular rate and rhythm, no S3  or significant systolic murmur, no pericardial rub. Abdomen: Soft, nontender, bowel sounds present, no guarding or rebound. Extremities: No pitting edema, distal pulses 2+.  ECG: I personally reviewed the tracing from 04/11/2015 which showed normal sinus rhythm.  Recent Labwork:  March 2016: Cholesterol 223, triglycerides 164, HDL 61, LDL 129, TSH 2.1, BUN 11, creatinine 0.7, potassium 4.4, AST 18, ALT 16, hemoglobin 13.6, platelets 373   Other Studies Reviewed Today:  Echocardiogram 04/13/2015: Study Conclusions  - Left ventricle: The cavity size was normal. Wall thickness was   normal. Systolic function was normal. The estimated ejection   fraction was in the range of 55% to 60%. Wall  motion was normal;   there were no regional wall motion abnormalities. Doppler   parameters are consistent with abnormal left ventricular   relaxation (grade 1 diastolic dysfunction). - Ventricular septum: Septal motion showed abnormal function and   dyssynergy. - Right atrium: Central venous pressure (est): 3 mm Hg. - Tricuspid valve: There was trivial regurgitation. - Pulmonary arteries: PA peak pressure: 27 mm Hg (S). - Pericardium, extracardiac: A prominent pericardial fat pad was   present.  Impressions:  - Normal LV wall thickness with LVEF 55-60% and grade 1 diastolic   dysfunction. Normal global longitudinal strain of -18%. Septal   dyssynergy noted. Trivial tricuspid regurgitation with PASP 27   mmHg.  Assessment and Plan:  1. History of nonobstructive CAD based on prior evaluation with no significant angina symptoms. ECG reviewed and stable. Would continue with observation and basic risk factor modification through PCP.  2. Status post pericardial cyst resection in 2010. Follow-up echocardiogram from last year reviewed.  Current medicines were reviewed with the patient today.   Orders Placed This Encounter  Procedures  . EKG 12-Lead    Disposition: Follow-up with me in one year.  Signed, Jonelle SidleSamuel G. Doristine Shehan, MD, [facility] 05/23/2016 3:36 PM    [facility] [address], [place], [place] [address] Phone: [phone]; Fax: [phone]

## 2016-05-23 NOTE — Patient Instructions (Signed)

## 2018-05-31 NOTE — Progress Notes (Signed)
Cardiology Office Note  Date: 06/01/2018   ID: [name] REYNOSO, DOB [birth year], MRN [id number]  PCP: Wendall Papa  Primary Cardiologist: Nona Dell, MD   Chief Complaint  Patient presents with  . Cardiac follow-up    History of Present Illness: [name] [name] is a 57 y.o. female last seen in October 2017.  She presents overdue for follow-up.  From a cardiac perspective, she does not report any angina symptoms, palpitations, or unusual shortness of breath.  She has not been exercising regularly, but does plan to get back to this soon.  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  I personally reviewed her ECG today which shows sinus rhythm with small R' in lead V1 and V2.  She states that she continues to follow regularly with her PCP and had lab work earlier this year which we will request.  In the past she had been on low-dose aspirin as well as statin with nonobstructive coronary artery atherosclerosis documented.  Echocardiogram from 2016 is outlined below.  Past Medical History:  Diagnosis Date  . Anxiety and depression   . CAD (coronary artery disease)    Nonobstructive 2008   . GERD (gastroesophageal reflux disease)   . Irritable bowel syndrome   . Migraine headache   . Pericardial cyst    Resected 2010   . Recurrent chest pain     Past Surgical History:  Procedure Laterality Date  . Left VAT with resection of pericardial cyst  November 2010   Dr. Donata [name]  . PARTIAL HYSTERECTOMY      Current Outpatient Medications  Medication Sig Dispense Refill  . Albuterol Sulfate 108 (90 Base) MCG/ACT AEPB Inhale into the lungs as needed.    . calcium carbonate (OS-CAL) 600 MG TABS tablet Take 600 mg by mouth daily.    Marland Kitchen ibandronate (BONIVA) 150 MG tablet Take 150 mg by mouth every 30 (thirty) days. Take in the morning with a full glass of water, on an empty stomach, and do not take anything else by mouth or lie down for the next 30  min.    . vitamin B-12 (CYANOCOBALAMIN) 1000 MCG tablet Take 1,000 mcg by mouth daily.     No current facility-administered medications for this visit.    Allergies:  Ciprofloxacin; Penicillins; Sulfamethoxazole-trimethoprim; and Tramadol hcl   Social History: The patient  reports that she quit smoking about 6 years ago. Her smoking use included cigarettes. She has never used smokeless tobacco. She reports that she does not drink alcohol or use drugs.   ROS:  Please see the history of present illness. Otherwise, complete review of systems is positive for none.  All other systems are reviewed and negative.   Physical Exam: VS:  BP 114/78   Pulse 90   Ht 5\' 3"  (1.6 m)   Wt 148 lb 3.2 oz (67.2 kg)   SpO2 95%   BMI 26.25 kg/m , BMI Body mass index is 26.25 kg/m.  Wt Readings from Last 3 Encounters:  06/01/18 148 lb 3.2 oz (67.2 kg)  05/23/16 143 lb 12.8 oz (65.2 kg)  04/11/15 144 lb 12.8 oz (65.7 kg)    General: Patient appears comfortable at rest. HEENT: Conjunctiva and lids normal, oropharynx clear. Neck: Supple, no elevated JVP or carotid bruits, no thyromegaly. Lungs: Clear to auscultation, nonlabored breathing at rest. Cardiac: Regular rate and rhythm, no S3 or significant systolic murmur, no pericardial rub. Abdomen: Soft, nontender, bowel  sounds present. Extremities: No pitting edema, distal pulses 2+. Skin: Warm and dry. Musculoskeletal: No kyphosis. Neuropsychiatric: Alert and oriented x3, affect grossly appropriate.  ECG: I personally reviewed the tracing from 05/23/2016 which shows normal sinus rhythm with small R' in lead V1 and V2.  Recent Labwork:  March 2016: Cholesterol 223, triglycerides 164, HDL 61, LDL 129, TSH 2.1, BUN 11, creatinine 0.7, potassium 4.4, AST 18, ALT 16, hemoglobin 13.6, platelets 373    Other Studies Reviewed Today:  Echocardiogram 04/13/2015: Study Conclusions  - Left ventricle: The cavity size was normal. Wall thickness was normal.  Systolic function was normal. The estimated ejection fraction was in the range of 55% to 60%. Wall motion was normal; there were no regional wall motion abnormalities. Doppler parameters are consistent with abnormal left ventricular relaxation (grade 1 diastolic dysfunction). - Ventricular septum: Septal motion showed abnormal function and dyssynergy. - Right atrium: Central venous pressure (est): 3 mm Hg. - Tricuspid valve: There was trivial regurgitation. - Pulmonary arteries: PA peak pressure: 27 mm Hg (S). - Pericardium, extracardiac: A prominent pericardial fat pad was present.  Impressions:  - Normal LV wall thickness with LVEF 55-60% and grade 1 diastolic dysfunction. Normal global longitudinal strain of -18%. Septal dyssynergy noted. Trivial tricuspid regurgitation with PASP 27 mmHg.  Assessment and Plan:  1.  History of pericardial cyst resection in 2010.  She has been clinically stable since that time.  No change on cardiac examination, no indication for follow-up echocardiogram at this time.  2.  Previously documented nonobstructive coronary atherosclerosis.  Follow-up ECG reviewed and stable.  Plan to obtain follow-up lab work from PCP as it relates to lipid control.  Also recommended that she resume aspirin 81 mg daily.   Current medicines were reviewed with the patient today.   Orders Placed This Encounter  Procedures  . EKG 12-Lead    Disposition: Follow-up in 1 year.  Signed, [name], MD, [facility] 06/01/2018 9:23 AM    [facility] at [facility] [address] [place], [place], [place] [address] Phone: [phone]; Fax: [phone]

## 2018-06-01 ENCOUNTER — Ambulatory Visit: Payer: BLUE CROSS/BLUE SHIELD | Admitting: Cardiology

## 2018-06-01 ENCOUNTER — Encounter: Payer: Self-pay | Admitting: *Deleted

## 2018-06-01 ENCOUNTER — Encounter: Payer: Self-pay | Admitting: Cardiology

## 2018-06-01 VITALS — BP 114/78 | HR 90 | Ht 63.0 in | Wt 148.2 lb

## 2018-06-01 DIAGNOSIS — Z8774 Personal history of (corrected) congenital malformations of heart and circulatory system: Secondary | ICD-10-CM

## 2018-06-01 DIAGNOSIS — I2510 Atherosclerotic heart disease of native coronary artery without angina pectoris: Secondary | ICD-10-CM | POA: Diagnosis not present

## 2018-06-01 MED ORDER — ASPIRIN EC 81 MG PO TBEC: 81.0000 mg | DELAYED_RELEASE_TABLET | Freq: Every day | ORAL | 3 refills | Status: AC

## 2018-06-01 NOTE — Patient Instructions (Signed)
Medication Instructions:   Your physician recommends that you continue on your current medications as directed. Please refer to the Current Medication list given to you today.  Start aspirin 81 mg by mouth daily.  Labwork:  NONE  Testing/Procedures:  NONE  Follow-Up:  Your physician recommends that you schedule a follow-up appointment in: 1 year. You will receive a reminder letter in the mail in about 10 months reminding you to call and schedule your appointment. If you don't receive this letter, please contact our office.  Any Other Special Instructions Will Be Listed Below (If Applicable).  If you need a refill on your cardiac medications before your next appointment, please call your pharmacy.

## 2019-06-04 ENCOUNTER — Telehealth: Payer: Self-pay | Admitting: Cardiology

## 2019-06-04 NOTE — Telephone Encounter (Signed)
Virtual Visit Pre-Appointment Phone Call  "(Name), I am calling you today to discuss your upcoming appointment. We are currently trying to limit exposure to the virus that causes COVID-19 by seeing patients at home rather than in the office."  1. "What is the BEST phone number to call the day of the visit?" - include this in appointment notes  2. Do you have or have access to (through a family member/friend) a smartphone with video capability that we can use for your visit?" a. If yes - list this number in appt notes as cell (if different from BEST phone #) and list the appointment type as a VIDEO visit in appointment notes b. If no - list the appointment type as a PHONE visit in appointment notes  3. Confirm consent - "In the setting of the current Covid19 crisis, you are scheduled for a (phone or video) visit with your provider on (date) at (time).  Just as we do with many in-office visits, in order for you to participate in this visit, we must obtain consent.  If you'd like, I can send this to your mychart (if signed up) or email for you to review.  Otherwise, I can obtain your verbal consent now.  All virtual visits are billed to your insurance company just like a normal visit would be.  By agreeing to a virtual visit, we'd like you to understand that the technology does not allow for your provider to perform an examination, and thus may limit your provider's ability to fully assess your condition. If your provider identifies any concerns that need to be evaluated in person, we will make arrangements to do so.  Finally, though the technology is pretty good, we cannot assure that it will always work on either your or our end, and in the setting of a video visit, we may have to convert it to a phone-only visit.  In either situation, we cannot ensure that we have a secure connection.  Are you willing to proceed?" STAFF: Did the patient verbally acknowledge consent to telehealth visit? Document  YES/NO here: yes  4. Advise patient to be prepared - "Two hours prior to your appointment, go ahead and check your blood pressure, pulse, oxygen saturation, and your weight (if you have the equipment to check those) and write them all down. When your visit starts, your provider will ask you for this information. If you have an Apple Watch or Kardia device, please plan to have heart rate information ready on the day of your appointment. Please have a pen and paper handy nearby the day of the visit as well."  5. Give patient instructions for MyChart download to smartphone OR Doximity/Doxy.me as below if video visit (depending on what platform provider is using)  6. Inform patient they will receive a phone call 15 minutes prior to their appointment time (may be from unknown caller ID) so they should be prepared to answer    TELEPHONE CALL NOTE  [name] [name] has been deemed a candidate for a follow-up tele-health visit to limit community exposure during the Covid-19 pandemic. I spoke with the patient via phone to ensure availability of phone/video source, confirm preferred email & phone number, and discuss instructions and expectations.  I reminded [name] [name] to be prepared with any vital sign and/or heart rhythm information that could potentially be obtained via home monitoring, at the time of her visit. I reminded [name] [name] to expect a phone call prior to  her visit.  Weston Anna 06/04/2019 2:16 PM   INSTRUCTIONS FOR DOWNLOADING THE MYCHART APP TO SMARTPHONE  - The patient must first make sure to have activated MyChart and know their login information - If Apple, go to [employer] and type in MyChart in the search bar and download the app. If Android, ask patient to go to [employer] and type in [facility] in the search bar and download the app. The app is free but as with any other app downloads, their phone may require them to verify saved payment information or Apple/Android  password.  - The patient will need to then log into the app with their MyChart username and password, and select [facility] as their healthcare provider to link the account. When it is time for your visit, go to the MyChart app, find appointments, and click Begin Video Visit. Be sure to Select Allow for your device to access the Microphone and Camera for your visit. You will then be connected, and your provider will be with you shortly.  **If they have any issues connecting, or need assistance please contact MyChart service desk [phone])**  **If using a computer, in order to ensure the best quality for their visit they will need to use either of the following Internet Browsers: [employer], or Google Chrome**  IF USING DOXIMITY or DOXY.ME - The patient will receive a link just prior to their visit by text.     FULL LENGTH CONSENT FOR TELE-HEALTH VISIT   I hereby voluntarily request, consent and authorize [facility] and its employed or contracted physicians, physician assistants, nurse practitioners or other licensed health care professionals (the Practitioner), to provide me with telemedicine health care services (the Services") as deemed necessary by the treating Practitioner. I acknowledge and consent to receive the Services by the Practitioner via telemedicine. I understand that the telemedicine visit will involve communicating with the Practitioner through live audiovisual communication technology and the disclosure of certain medical information by electronic transmission. I acknowledge that I have been given the opportunity to request an in-person assessment or other available alternative prior to the telemedicine visit and am voluntarily participating in the telemedicine visit.  I understand that I have the right to withhold or withdraw my consent to the use of telemedicine in the course of my care at any time, without affecting my right to future care or treatment,  and that the Practitioner or I may terminate the telemedicine visit at any time. I understand that I have the right to inspect all information obtained and/or recorded in the course of the telemedicine visit and may receive copies of available information for a reasonable fee.  I understand that some of the potential risks of receiving the Services via telemedicine include:   Delay or interruption in medical evaluation due to technological equipment failure or disruption;  Information transmitted may not be sufficient (e.g. poor resolution of images) to allow for appropriate medical decision making by the Practitioner; and/or   In rare instances, security protocols could fail, causing a breach of personal health information.  Furthermore, I acknowledge that it is my responsibility to provide information about my medical history, conditions and care that is complete and accurate to the best of my ability. I acknowledge that Practitioner's advice, recommendations, and/or decision may be based on factors not within their control, such as incomplete or inaccurate data provided by me or distortions of diagnostic images or specimens that may result from electronic transmissions. I  understand that the practice of medicine is not an exact science and that Practitioner makes no warranties or guarantees regarding treatment outcomes. I acknowledge that I will receive a copy of this consent concurrently upon execution via email to the email address I last provided but may also request a printed copy by calling the office of [facility].    I understand that my insurance will be billed for this visit.   I have read or had this consent read to me.  I understand the contents of this consent, which adequately explains the benefits and risks of the Services being provided via telemedicine.   I have been provided ample opportunity to ask questions regarding this consent and the Services and have had my questions  answered to my satisfaction.  I give my informed consent for the services to be provided through the use of telemedicine in my medical care  By participating in this telemedicine visit I agree to the above.

## 2019-06-10 ENCOUNTER — Telehealth (INDEPENDENT_AMBULATORY_CARE_PROVIDER_SITE_OTHER): Payer: BC Managed Care – PPO | Admitting: Cardiology

## 2019-06-10 ENCOUNTER — Encounter: Payer: Self-pay | Admitting: Cardiology

## 2019-06-10 ENCOUNTER — Encounter: Payer: Self-pay | Admitting: *Deleted

## 2019-06-10 DIAGNOSIS — I2510 Atherosclerotic heart disease of native coronary artery without angina pectoris: Secondary | ICD-10-CM | POA: Diagnosis not present

## 2019-06-10 NOTE — Progress Notes (Signed)
Virtual Visit via Telephone Note   This visit type was conducted due to national recommendations for restrictions regarding the COVID-19 Pandemic (e.g. social distancing) in an effort to limit this patient's exposure and mitigate transmission in our community.  Due to her co-morbid illnesses, this patient is at least at moderate risk for complications without adequate follow up.  This format is felt to be most appropriate for this patient at this time.  The patient did not have access to video technology/had technical difficulties with video requiring transitioning to audio format only (telephone).  All issues noted in this document were discussed and addressed.  No physical exam could be performed with this format.  Please refer to the patient's chart for her  consent to telehealth for [facility].   Date:  06/10/2019   ID:  [name] [name], DOB 1961/06/16, MRN [id number]  Patient Location: Home Provider Location: Office  PCP:  Wendall Papa  Cardiologist:  Nona Dell, MD Electrophysiologist:  None   Evaluation Performed:  Follow-Up Visit  Chief Complaint:   Cardiac follow-up  History of Present Illness:    [name] [name] is a 58 y.o. female last seen in October 2019.  We spoke by phone today.  She tells me that she has been doing fairly well, still working in [place], a Programmer, applications business.  She has been busy with work, not exercising very regularly.  I went over her medications which are listed below.  She has been taking aspirin regularly.  We are requesting her lab work from June to review lipids.  I talked with her today about considering low-dose statin therapy particularly if LDL is in similar range to last year.  In the past she was on Zocor and tolerated it by report.  I talked with her about a regular exercise plan.  The patient does not have symptoms concerning for COVID-19 infection (fever, chills, cough, or new shortness of breath).    Past Medical  History:  Diagnosis Date  . Anxiety and depression   . CAD (coronary artery disease)    Nonobstructive 2008   . GERD (gastroesophageal reflux disease)   . Irritable bowel syndrome   . Migraine headache   . Pericardial cyst    Resected 2010   . Recurrent chest pain    Past Surgical History:  Procedure Laterality Date  . Left VAT with resection of pericardial cyst  November 2010   [name]  . PARTIAL HYSTERECTOMY       Current Meds  Medication Sig  . Albuterol Sulfate 108 (90 Base) MCG/ACT AEPB Inhale into the lungs as needed.  Marland Kitchen aspirin EC 81 MG tablet Take 1 tablet (81 mg total) by mouth daily.  . calcium carbonate (OS-CAL) 600 MG TABS tablet Take 600 mg by mouth daily.  Marland Kitchen ibandronate (BONIVA) 150 MG tablet Take 150 mg by mouth every 30 (thirty) days. Take in the morning with a full glass of water, on an empty stomach, and do not take anything else by mouth or lie down for the next 30 min.  . vitamin B-12 (CYANOCOBALAMIN) 1000 MCG tablet Take 1,000 mcg by mouth daily.     Allergies:   Ciprofloxacin, Penicillins, Sulfamethoxazole-trimethoprim, and Tramadol hcl   Social History   Tobacco Use  . Smoking status: Former Smoker    Types: Cigarettes    Quit date: 08/13/2011    Years since quitting: 7.8  . Smokeless tobacco: Never Used  Substance Use Topics  .  Alcohol use: No    Alcohol/week: 0.0 standard drinks  . Drug use: No     Family Hx: The patient's family history includes COPD in her mother; Hypertension in her mother; Lung cancer in her mother; Transient ischemic attack in her mother.  ROS:   Please see the history of present illness. All other systems reviewed and are negative.   Prior CV studies:   The following studies were reviewed today:  Echocardiogram 04/13/2015: Study Conclusions  - Left ventricle: The cavity size was normal. Wall thickness was normal. Systolic function was normal. The estimated ejection fraction was in the range of 55% to  60%. Wall motion was normal; there were no regional wall motion abnormalities. Doppler parameters are consistent with abnormal left ventricular relaxation (grade 1 diastolic dysfunction). - Ventricular septum: Septal motion showed abnormal function and dyssynergy. - Right atrium: Central venous pressure (est): 3 mm Hg. - Tricuspid valve: There was trivial regurgitation. - Pulmonary arteries: PA peak pressure: 27 mm Hg (S). - Pericardium, extracardiac: A prominent pericardial fat pad was present.  Impressions:  - Normal LV wall thickness with LVEF 55-60% and grade 1 diastolic dysfunction. Normal global longitudinal strain of -18%. Septal dyssynergy noted. Trivial tricuspid regurgitation with PASP 27 mmHg.  Labs/Other Tests and Data Reviewed:    EKG:  An ECG dated 06/01/2018 was personally reviewed today and demonstrated:  Sinus rhythm with small R' in lead V1 and V2.  Recent Labs:  June 2019: Hemoglobin 13.0, platelets 367, TSH 1.52, BUN 11, creatinine 0.60, potassium 4.3, AST 16, ALT 14, cholesterol 184, triglycerides 96, HDL 57, LDL 108   Wt Readings from Last 3 Encounters:  06/10/19 148 lb (67.1 kg)  06/01/18 148 lb 3.2 oz (67.2 kg)  05/23/16 143 lb 12.8 oz (65.2 kg)     Objective:    Vital Signs:  BP 128/69   Pulse 83   Ht 5\' 3"  (1.6 m)   Wt 148 lb (67.1 kg)   BMI 26.22 kg/m    Patient spoke in full sentences, not short of breath. No audible wheezing or coughing. Speech pattern normal.  ASSESSMENT & PLAN:    1.  Nonobstructive coronary atherosclerosis based on previous work-up around the time of pericardial cyst resection in 2010.  She does not report any active angina symptoms and we continue with observation.  She has been taking aspirin 81 mg daily.  I talked with her about adding low-dose statin, we will request her most recent lipid panel from PCP first.  We will plan to get an ECG for her follow-up visit.  2.  Pericardial cyst status post  resection in 2010.  COVID-19 Education: The signs and symptoms of COVID-19 were discussed with the patient and how to seek care for testing (follow up with PCP or arrange E-visit).  The importance of social distancing was discussed today.  Time:   Today, I have spent 8 minutes with the patient with telehealth technology discussing the above problems.     Medication Adjustments/Labs and Tests Ordered: Current medicines are reviewed at length with the patient today.  Concerns regarding medicines are outlined above.   Tests Ordered: No orders of the defined types were placed in this encounter.   Medication Changes: No orders of the defined types were placed in this encounter.   Follow Up:  1 year in the [place] office.   Signed, Rozann Lesches, MD  06/10/2019 3:00 PM    [place]

## 2019-06-10 NOTE — Patient Instructions (Addendum)

## 2019-06-14 ENCOUNTER — Telehealth: Payer: Self-pay | Admitting: *Deleted

## 2019-06-14 DIAGNOSIS — E785 Hyperlipidemia, unspecified: Secondary | ICD-10-CM

## 2019-06-14 DIAGNOSIS — I2510 Atherosclerotic heart disease of native coronary artery without angina pectoris: Secondary | ICD-10-CM

## 2019-06-14 MED ORDER — ROSUVASTATIN CALCIUM 5 MG PO TABS: 5.0000 mg | ORAL_TABLET | Freq: Every day | ORAL | 3 refills | Status: DC

## 2019-06-14 NOTE — Telephone Encounter (Signed)
-----   Message from Satira Sark, MD sent at 06/13/2019  2:38 PM EST ----- Results reviewed.  LDL 122 from PCP lab work.  She is on aspirin, we did discuss starting statin for further risk factor reduction with atherosclerosis.  Consider Crestor 5 mg daily.  If tolerates well, recheck FLP in 12 weeks.

## 2019-06-14 NOTE — Telephone Encounter (Signed)
Patient informed and verbalized understanding of plan. 

## 2019-09-28 ENCOUNTER — Telehealth: Payer: Self-pay | Admitting: Cardiology

## 2019-09-28 NOTE — Telephone Encounter (Signed)
Patient called requesting recent lab results.  °

## 2019-09-28 NOTE — Telephone Encounter (Signed)
Patient informed. Copy sent to PCP °

## 2019-09-28 NOTE — Telephone Encounter (Signed)
-----   Message from [name], MD sent at 09/28/2019  8:40 AM EST ----- Results reviewed.  LDL is 69, actually much better than last year's lab work.  Hold off on adding any new medications for now.

## 2020-02-09 ENCOUNTER — Other Ambulatory Visit (HOSPITAL_COMMUNITY): Payer: Self-pay | Admitting: Nurse Practitioner

## 2020-02-09 DIAGNOSIS — Z1231 Encounter for screening mammogram for malignant neoplasm of breast: Secondary | ICD-10-CM

## 2020-03-01 ENCOUNTER — Ambulatory Visit (HOSPITAL_COMMUNITY)
Admission: RE | Admit: 2020-03-01 | Discharge: 2020-03-01 | Disposition: A | Discharge status: Home / Self Care | Payer: 59 | Source: Ambulatory Visit | Attending: Nurse Practitioner | Admitting: Nurse Practitioner

## 2020-03-01 ENCOUNTER — Other Ambulatory Visit: Payer: Self-pay

## 2020-03-01 ENCOUNTER — Encounter (HOSPITAL_COMMUNITY): Payer: Self-pay

## 2020-03-01 DIAGNOSIS — Z1231 Encounter for screening mammogram for malignant neoplasm of breast: Secondary | ICD-10-CM | POA: Diagnosis present

## 2020-08-03 ENCOUNTER — Other Ambulatory Visit: Payer: Self-pay | Admitting: Cardiology

## 2020-09-03 NOTE — Progress Notes (Signed)
Cardiology Office Note  Date: 09/04/2020   ID: [name] [name], [name] [name] [birth year], MRN [id number]  PCP:  Wendall Papa  Cardiologist:  Nona Dell, MD Electrophysiologist:  None   Chief Complaint: Follow up CAD  History of Present Illness: [name] [name] is a 60 y.o. female with a history of CAD, GERD, Recurrent CP.  On last encounter with Dr. Diona Browner via telemedicine visit 06/10/2019.  She had been busy at work and not exercising very regularly.  He talked about a regular exercise plan.  He spoke about considering low-dose statin therapy.  She had been on Zocor in the past and was tolerating it.  History of nonobstructive disease based on previous work-up for pericardial cyst resection 2010.  No reported anginal symptoms.  Continue with observation and taking aspirin 81 mg daily.  He was requesting her most recent lipid panel from PCP.  She is here today for 1 year follow-up.  She denies any recent acute illnesses or hospitalizations though she did say that she had the Covid virus but had very mild symptoms.  She denies any recent anginal or exertional symptoms, palpitations or arrhythmias, orthostatic symptoms, CVA or TIA like symptoms, PND, orthopnea, bleeding.  Denies any claudication-like symptoms, DVT or PE-like symptoms, or lower extremity edema.  She states the last lab work she had with her primary care provider looked good.  Her LDL was 69 on last lab work.  States she would like to lose about 20 pounds of weight.   Past Medical History:  Diagnosis Date  . Anxiety and depression   . CAD (coronary artery disease)    Nonobstructive 2008   . GERD (gastroesophageal reflux disease)   . Irritable bowel syndrome   . Migraine headache   . Pericardial cyst    Resected 2010   . Recurrent chest pain     Past Surgical History:  Procedure Laterality Date  . BREAST BIOPSY Left   . Left VAT with resection of pericardial cyst  November 2010   [name]  . PARTIAL  HYSTERECTOMY      Current Outpatient Medications  Medication Sig Dispense Refill  . Albuterol Sulfate 108 (90 Base) MCG/ACT AEPB Inhale into the lungs as needed.    Marland Kitchen aspirin EC 81 MG tablet Take 1 tablet (81 mg total) by mouth daily. 90 tablet 3  . calcium carbonate (OS-CAL) 600 MG TABS tablet Take 600 mg by mouth daily.    Marland Kitchen ibandronate (BONIVA) 150 MG tablet Take 150 mg by mouth every 30 (thirty) days. Take in the morning with a full glass of water, on an empty stomach, and do not take anything else by mouth or lie down for the next 30 min.    . rosuvastatin (CRESTOR) 5 MG tablet TAKE 1 TABLET BY MOUTH DAILY 90 tablet 1  . vitamin B-12 (CYANOCOBALAMIN) 1000 MCG tablet Take 1,000 mcg by mouth daily.    . Vitamin D, Ergocalciferol, (DRISDOL) 1.25 MG (50000 UT) CAPS capsule Take 1 capsule by mouth 2 (two) times a week.     No current facility-administered medications for this visit.   Allergies:  Ciprofloxacin, Penicillins, Sulfamethoxazole-trimethoprim, and Tramadol hcl   Social History: The patient  reports that she quit smoking about 9 years ago. Her smoking use included cigarettes. She has never used smokeless tobacco. She reports that she does not drink alcohol and does not use drugs.   Family History: The patient's family history includes COPD in her mother; Hypertension in  her mother; Lung cancer in her mother; Transient ischemic attack in her mother.   ROS:  Please see the history of present illness. Otherwise, complete review of systems is positive for none.  All other systems are reviewed and negative.   Physical Exam: VS:  BP 120/80   Pulse (!) 103   Resp 16   Ht 5' 3.5" (1.613 m)   Wt 144 lb 12.8 oz (65.7 kg)   SpO2 99%   BMI 25.25 kg/m , BMI Body mass index is 25.25 kg/m.  Wt Readings from Last 3 Encounters:  09/04/20 144 lb 12.8 oz (65.7 kg)  06/10/19 148 lb (67.1 kg)  06/01/18 148 lb 3.2 oz (67.2 kg)    General: Patient appears comfortable at rest. Neck: Supple,  no elevated JVP or carotid bruits, no thyromegaly. Lungs: Clear to auscultation, nonlabored breathing at rest. Cardiac: Regular rate and rhythm, no S3 or significant systolic murmur, no pericardial rub. Extremities: No pitting edema, distal pulses 2+. Skin: Warm and dry. Musculoskeletal: No kyphosis. Neuropsychiatric: Alert and oriented x3, affect grossly appropriate.  ECG:  An ECG dated 09/04/2020 was personally reviewed today and demonstrated:  Sinus tachycardia nonspecific ST abnormality rate of 109.  Recent Labwork: No results found for requested labs within last 8760 hours.  No results found for: CHOL, TRIG, HDL, CHOLHDL, VLDL, LDLCALC, LDLDIRECT  Other Studies Reviewed Today:  Echocardiogram 04/13/2015: Study Conclusions  - Left ventricle: The cavity size was normal. Wall thickness was normal. Systolic function was normal. The estimated ejection fraction was in the range of 55% to 60%. Wall motion was normal; there were no regional wall motion abnormalities. Doppler parameters are consistent with abnormal left ventricular relaxation (grade 1 diastolic dysfunction). - Ventricular septum: Septal motion showed abnormal function and dyssynergy. - Right atrium: Central venous pressure (est): 3 mm Hg. - Tricuspid valve: There was trivial regurgitation. - Pulmonary arteries: PA peak pressure: 27 mm Hg (S). - Pericardium, extracardiac: A prominent pericardial fat pad was present.  Impressions:  - Normal LV wall thickness with LVEF 55-60% and grade 1 diastolic dysfunction. Normal global longitudinal strain of -18%. Septal dyssynergy noted. Trivial tricuspid regurgitation with PASP 27 mmHg.  Assessment and Plan:  1. Atherosclerosis of native coronary artery of native heart without angina pectoris   2. Status post pericardial cyst excision   3. Mixed hyperlipidemia    1. Atherosclerosis of native coronary artery of native heart without angina  pectoris Denies any anginal or exertional symptoms.  Continue aspirin 81 mg daily.  2. Status post pericardial cyst excision Previous excision of pericardial cyst back in 2010 by Dr. Maren Beach.  No issues subsequent.  3. Mixed hyperlipidemia Continue Crestor 5 mg daily.  Last LDL was 69  Medication Adjustments/Labs and Tests Ordered: Current medicines are reviewed at length with the patient today.  Concerns regarding medicines are outlined above.   Disposition: Follow-up with Dr. Diona Browner or APP 1 year  Signed, Rennis Harding, NP 09/04/2020 8:12 AM    [facility] at [facility] [address] [place], [place], [place] [address] Phone: [phone]; Fax: [phone]

## 2020-09-04 ENCOUNTER — Other Ambulatory Visit: Payer: Self-pay

## 2020-09-04 ENCOUNTER — Ambulatory Visit (INDEPENDENT_AMBULATORY_CARE_PROVIDER_SITE_OTHER): Payer: 59 | Admitting: Family Medicine

## 2020-09-04 ENCOUNTER — Encounter: Payer: Self-pay | Admitting: Family Medicine

## 2020-09-04 VITALS — BP 120/80 | HR 103 | Resp 16 | Ht 63.5 in | Wt 144.8 lb

## 2020-09-04 DIAGNOSIS — Z8774 Personal history of (corrected) congenital malformations of heart and circulatory system: Secondary | ICD-10-CM

## 2020-09-04 DIAGNOSIS — I2510 Atherosclerotic heart disease of native coronary artery without angina pectoris: Secondary | ICD-10-CM

## 2020-09-04 DIAGNOSIS — E782 Mixed hyperlipidemia: Secondary | ICD-10-CM

## 2020-09-04 NOTE — Patient Instructions (Signed)

## 2020-09-06 NOTE — Addendum Note (Signed)
Addended by: Burman Nieves T on: 09/06/2020 09:02 AM   Modules accepted: Orders

## 2021-01-22 ENCOUNTER — Other Ambulatory Visit: Payer: Self-pay | Admitting: Cardiology

## 2021-03-06 ENCOUNTER — Other Ambulatory Visit (HOSPITAL_COMMUNITY): Payer: Self-pay | Admitting: Nurse Practitioner

## 2021-03-06 DIAGNOSIS — Z1231 Encounter for screening mammogram for malignant neoplasm of breast: Secondary | ICD-10-CM

## 2021-03-16 ENCOUNTER — Ambulatory Visit (HOSPITAL_COMMUNITY)
Admission: RE | Admit: 2021-03-16 | Discharge: 2021-03-16 | Disposition: A | Discharge status: Home / Self Care | Payer: 59 | Source: Ambulatory Visit | Attending: Nurse Practitioner | Admitting: Nurse Practitioner

## 2021-03-16 ENCOUNTER — Other Ambulatory Visit: Payer: Self-pay

## 2021-03-16 DIAGNOSIS — Z1231 Encounter for screening mammogram for malignant neoplasm of breast: Secondary | ICD-10-CM | POA: Diagnosis not present

## 2021-05-21 ENCOUNTER — Observation Stay (HOSPITAL_COMMUNITY)
Admission: EM | Admit: 2021-05-21 | Discharge: 2021-05-22 | Disposition: A | Discharge status: Home / Self Care | Payer: 59 | Attending: Internal Medicine | Admitting: Internal Medicine

## 2021-05-21 ENCOUNTER — Other Ambulatory Visit (HOSPITAL_COMMUNITY): Payer: 59

## 2021-05-21 ENCOUNTER — Emergency Department (HOSPITAL_COMMUNITY): Payer: 59

## 2021-05-21 ENCOUNTER — Encounter (HOSPITAL_COMMUNITY): Payer: Self-pay | Admitting: Emergency Medicine

## 2021-05-21 ENCOUNTER — Other Ambulatory Visit: Payer: Self-pay

## 2021-05-21 DIAGNOSIS — E785 Hyperlipidemia, unspecified: Secondary | ICD-10-CM

## 2021-05-21 DIAGNOSIS — Z7982 Long term (current) use of aspirin: Secondary | ICD-10-CM | POA: Diagnosis not present

## 2021-05-21 DIAGNOSIS — R072 Precordial pain: Secondary | ICD-10-CM | POA: Diagnosis not present

## 2021-05-21 DIAGNOSIS — Z87891 Personal history of nicotine dependence: Secondary | ICD-10-CM | POA: Diagnosis not present

## 2021-05-21 DIAGNOSIS — Z20822 Contact with and (suspected) exposure to covid-19: Secondary | ICD-10-CM | POA: Diagnosis not present

## 2021-05-21 DIAGNOSIS — I2510 Atherosclerotic heart disease of native coronary artery without angina pectoris: Secondary | ICD-10-CM | POA: Diagnosis not present

## 2021-05-21 DIAGNOSIS — R079 Chest pain, unspecified: Secondary | ICD-10-CM | POA: Diagnosis present

## 2021-05-21 DIAGNOSIS — J439 Emphysema, unspecified: Secondary | ICD-10-CM | POA: Diagnosis not present

## 2021-05-21 LAB — CBC
HCT: 40.2 % (ref 36.0–46.0)
Hemoglobin: 13.7 g/dL (ref 12.0–15.0)
MCH: 32.9 pg (ref 26.0–34.0)
MCHC: 34.1 g/dL (ref 30.0–36.0)
MCV: 96.6 fL (ref 80.0–100.0)
Platelets: 354 10*3/uL (ref 150–400)
RBC: 4.16 MIL/uL (ref 3.87–5.11)
RDW: 12.4 % (ref 11.5–15.5)
WBC: 5 10*3/uL (ref 4.0–10.5)
nRBC: 0 % (ref 0.0–0.2)

## 2021-05-21 LAB — RESP PANEL BY RT-PCR (FLU A&B, COVID) ARPGX2
Influenza A by PCR: NEGATIVE
Influenza B by PCR: NEGATIVE
SARS Coronavirus 2 by RT PCR: NEGATIVE

## 2021-05-21 LAB — TROPONIN I (HIGH SENSITIVITY)
Troponin I (High Sensitivity): 4 ng/L (ref <18)
Troponin I (High Sensitivity): 9 ng/L (ref <18)
Troponin I (High Sensitivity): 22 ng/L — ABNORMAL HIGH (ref <18)
Troponin I (High Sensitivity): 19 ng/L — ABNORMAL HIGH (ref <18)
Troponin I (High Sensitivity): 14 ng/L (ref <18)

## 2021-05-21 LAB — BASIC METABOLIC PANEL
Anion gap: 7 (ref 5–15)
BUN: 14 mg/dL (ref 6–20)
CO2: 26 mmol/L (ref 22–32)
Calcium: 9.3 mg/dL (ref 8.9–10.3)
Chloride: 104 mmol/L (ref 98–111)
Creatinine, Ser: 0.74 mg/dL (ref 0.44–1.00)
GFR, Estimated: >60 mL/min (ref >60)
Glucose, Bld: 107 mg/dL — ABNORMAL HIGH (ref 70–99)
Potassium: 3.7 mmol/L (ref 3.5–5.1)
Sodium: 137 mmol/L (ref 135–145)

## 2021-05-21 LAB — D-DIMER, QUANTITATIVE: D-Dimer, Quant: 0.31 ug/mL-FEU (ref 0.00–0.50)

## 2021-05-21 MED ORDER — ROSUVASTATIN CALCIUM 10 MG PO TABS
5.0000 mg | ORAL_TABLET | Freq: Every day | ORAL | Status: DC
  Administered 2021-05-21 – 2021-05-22 (×2): 5 mg via ORAL
  Filled 2021-05-21 (×2): qty 1

## 2021-05-21 MED ORDER — MORPHINE SULFATE (PF) 4 MG/ML IV SOLN
4.0000 mg | Freq: Once | INTRAVENOUS | Status: AC
  Administered 2021-05-21: 4 mg via INTRAVENOUS
  Filled 2021-05-21: qty 1

## 2021-05-21 MED ORDER — ACETAMINOPHEN 325 MG PO TABS: 650.0000 mg | ORAL_TABLET | Freq: Four times a day (QID) | ORAL | Status: DC | PRN

## 2021-05-21 MED ORDER — ENOXAPARIN SODIUM 40 MG/0.4ML IJ SOSY
40.0000 mg | PREFILLED_SYRINGE | INTRAMUSCULAR | Status: DC
  Administered 2021-05-21: 40 mg via SUBCUTANEOUS
  Filled 2021-05-21: qty 0.4

## 2021-05-21 MED ORDER — ASPIRIN 325 MG PO TABS
325.0000 mg | ORAL_TABLET | Freq: Every day | ORAL | Status: DC
  Administered 2021-05-22: 325 mg via ORAL
  Filled 2021-05-21: qty 1

## 2021-05-21 MED ORDER — MELATONIN 5 MG PO TABS
10.0000 mg | ORAL_TABLET | Freq: Every evening | ORAL | Status: DC | PRN
  Filled 2021-05-21: qty 2

## 2021-05-21 MED ORDER — ACETAMINOPHEN 650 MG RE SUPP: 650.0000 mg | Freq: Four times a day (QID) | RECTAL | Status: DC | PRN

## 2021-05-21 MED ORDER — IOHEXOL 350 MG/ML SOLN
75.0000 mL | Freq: Once | INTRAVENOUS | Status: AC | PRN
  Administered 2021-05-21: 75 mL via INTRAVENOUS

## 2021-05-21 MED ORDER — NITROGLYCERIN 0.4 MG SL SUBL
0.4000 mg | SUBLINGUAL_TABLET | SUBLINGUAL | Status: DC | PRN
Start: 2021-05-21 — End: 2021-05-22
  Administered 2021-05-21 (×3): 0.4 mg via SUBLINGUAL
  Filled 2021-05-21: qty 1

## 2021-05-21 MED ORDER — ONDANSETRON HCL 4 MG/2ML IJ SOLN: 4.0000 mg | Freq: Four times a day (QID) | INTRAMUSCULAR | Status: DC | PRN

## 2021-05-21 MED ORDER — ALUM & MAG HYDROXIDE-SIMETH 200-200-20 MG/5ML PO SUSP
30.0000 mL | Freq: Once | ORAL | Status: AC
  Administered 2021-05-21: 30 mL via ORAL
  Filled 2021-05-21: qty 30

## 2021-05-21 MED ORDER — ASPIRIN 81 MG PO CHEW
324.0000 mg | CHEWABLE_TABLET | Freq: Once | ORAL | Status: AC
  Administered 2021-05-21: 324 mg via ORAL
  Filled 2021-05-21: qty 4

## 2021-05-21 MED ORDER — ACETAMINOPHEN 500 MG PO TABS
1000.0000 mg | ORAL_TABLET | Freq: Once | ORAL | Status: AC
  Administered 2021-05-21: 1000 mg via ORAL
  Filled 2021-05-21: qty 2

## 2021-05-21 MED ORDER — ONDANSETRON HCL 4 MG PO TABS: 4.0000 mg | ORAL_TABLET | Freq: Four times a day (QID) | ORAL | Status: DC | PRN

## 2021-05-21 MED ORDER — LIDOCAINE VISCOUS HCL 2 % MT SOLN
15.0000 mL | Freq: Once | OROMUCOSAL | Status: AC
  Administered 2021-05-21: 15 mL via ORAL
  Filled 2021-05-21: qty 15

## 2021-05-21 NOTE — Consult Note (Addendum)
Cardiology Consultation:   Patient ID: LAJEANA STROUGH MRN: [id number]; DOB: 1961-01-17  Admit date: 05/21/2021 Date of Consult: 05/21/2021  PCP:  [facility] Providers Cardiologist:  Nona Dell, MD        Patient Profile:   TEREA NEUBAUER is a 60 y.o. female with a hx of nonobstructive CAD, pericardial cyst resection 2010, former tobacco use, anxiety, depression, GERD, IBS who is being seen 05/21/2021 for the evaluation of chest pain at the request of Dr. Charm Barges.  History of Present Illness:   Ms. Pagaduan has followed with Dr. Diona Browner for history of chest pain. She had a cardiac cath in 2008 showing 50% LAD otherwise no other significant CAD. She went on to have pericardial cyst resected in 2010. Last 2D echo 04/2015 EF 55-60%, grade 1 DD, prominent pericardial fat pad.  She has been in her USOH lately although has noticed episodes of paroxysmal SOB just while sitting at her desk. This morning when she woke up she states her face felt weird. She fell back asleep and when she awoke around 6 she had sharp, stabbing chest pain that hurt in her neck and left arm with some associated pressure. She rated it 10/10. This was worse if she took a deep breath. She also noticed left arm weakness and she could not lift this to pick up a cup of coffee. It is not clear if this was due to the pain or an actual deficit. She also felt like she could not walk straight due to significant dizziness. CXR +aortic atherosclerosis, no acute CP disease. No neurologic workup. Due to the pain she came to the ED and received 3 SL NTG, 324mg  ASA, morphine, GI cocktail and pain gradually subsided to 2/10. She is not sure which medicine in particular helped bring the pain down. She is much more comfortable right now and denies any residual neurologic symptoms. Troponins 4 -> 9 -> 22, CBC wnl, d-dimer wnl, BMET OK except glucose 107.   Past Medical History:  Diagnosis Date   Anxiety and  depression    CAD (coronary artery disease)    Nonobstructive 2008    GERD (gastroesophageal reflux disease)    Irritable bowel syndrome    Migraine headache    Pericardial cyst    Resected 2010    Recurrent chest pain     Past Surgical History:  Procedure Laterality Date   BREAST BIOPSY Left    Left VAT with resection of pericardial cyst  November 2010   Dr. December 2010   PARTIAL HYSTERECTOMY       Home Medications:  Prior to Admission medications   Medication Sig Start Date End Date Taking? Authorizing Provider  aspirin EC 81 MG tablet Take 1 tablet (81 mg total) by mouth daily. 06/01/18  Yes 06/03/18, MD  ibandronate (BONIVA) 150 MG tablet Take 150 mg by mouth every 30 (thirty) days. Take in the morning with a full glass of water, on an empty stomach, and do not take anything else by mouth or lie down for the next 30 min.   Yes [provider]  rosuvastatin (CRESTOR) 5 MG tablet TAKE 1 TABLET BY MOUTH DAILY Patient taking differently: Take 5 mg by mouth daily. 01/22/21  Yes 01/24/21, MD  vitamin B-12 (CYANOCOBALAMIN) 1000 MCG tablet Take 1,000 mcg by mouth daily.   Yes [provider]    Inpatient Medications: Scheduled Meds:  Continuous Infusions:  PRN Meds: nitroGLYCERIN  Allergies:    Allergies  Allergen Reactions   Ciprofloxacin Diarrhea    c-diff per patient    Penicillins     REACTION: rash   Sulfamethoxazole-Trimethoprim     REACTION: SOB   Tramadol Hcl     REACTION: facial swelling    Social History:   Social History   Socioeconomic History   Marital status: Divorced    Spouse name: Not on file   Number of children: Not on file   Years of education: Not on file   Highest education level: Not on file  Occupational History   Not on file  Tobacco Use   Smoking status: Former    Types: Cigarettes    Quit date: 08/13/2011    Years since quitting: 9.7   Smokeless tobacco: Never   Tobacco comments:    Smoked for  20-some years  Vaping Use   Vaping Use: Never used  Substance and Sexual Activity   Alcohol use: No    Comment: rare glass of wine   Drug use: No   Sexual activity: Not on file  Other Topics Concern   Not on file  Social History Narrative   Not on file   Social Determinants of Health   Financial Resource Strain: Not on file  Food Insecurity: Not on file  Transportation Needs: Not on file  Physical Activity: Not on file  Stress: Not on file  Social Connections: Not on file  Intimate Partner Violence: Not on file    Family History:    Family History  Problem Relation Age of Onset   Lung cancer Mother    Transient ischemic attack Mother    COPD Mother    Hypertension Mother      ROS:  Please see the history of present illness.  All other ROS reviewed and negative.     Physical Exam/Data:   Vitals:   05/21/21 1000 05/21/21 1015 05/21/21 1030 05/21/21 1100  BP: 121/76  111/72 128/76  Pulse: 68 65 66 70  Resp: 16 12 13 16   Temp:      TempSrc:      SpO2: 92% 92% 91% 95%  Weight:      Height:       No intake or output data in the 24 hours ending 05/21/21 1249 Last 3 Weights 05/21/2021 09/04/2020 06/10/2019  Weight (lbs) 155 lb 144 lb 12.8 oz 148 lb  Weight (kg) 70.308 kg 65.681 kg 67.132 kg     Body mass index is 27.46 kg/m.  General: Well developed, well nourished WF, in no acute distress. Head: Normocephalic, atraumatic, sclera non-icteric, no xanthomas, nares are without discharge. Neck: Negative for carotid bruits. JVP not elevated. Lungs: Clear bilaterally to auscultation without wheezes, rales, or rhonchi. Breathing is unlabored. Heart: RRR S1 S2 without murmurs, rubs, or gallops.  Abdomen: Soft, non-tender, non-distended with normoactive bowel sounds. No rebound/guarding. Extremities: No clubbing or cyanosis. No edema. Distal pedal pulses are 2+ and equal bilaterally. Neuro: Alert and oriented X 3. Moves all extremities spontaneously. No focal neurologic  sx. Psych:  Responds to questions appropriately with a normal affect.   EKG:  The EKG was personally reviewed and demonstrates:  NSR with ST depression/TWI inferiorly and V5-V6 largely similar to prior  Telemetry:  Telemetry was personally reviewed and demonstrates:  NSR  Relevant CV Studies: 2D Echo 04/2015 - Left ventricle: The cavity size was normal. Wall thickness was    normal. Systolic function was normal. The estimated ejection  fraction was in the range of 55% to 60%. Wall motion was normal;    there were no regional wall motion abnormalities. Doppler    parameters are consistent with abnormal left ventricular    relaxation (grade 1 diastolic dysfunction).  - Ventricular septum: Septal motion showed abnormal function and    dyssynergy.  - Right atrium: Central venous pressure (est): 3 mm Hg.  - Tricuspid valve: There was trivial regurgitation.  - Pulmonary arteries: PA peak pressure: 27 mm Hg (S).  - Pericardium, extracardiac: A prominent pericardial fat pad was    present.   Impressions:   - Normal LV wall thickness with LVEF 55-60% and grade 1 diastolic    dysfunction. Normal global longitudinal strain of -18%. Septal    dyssynergy noted. Trivial tricuspid regurgitation with PASP 27    mmHg.   CARDIAC CATHETERIZATION 2008    HISTORY:  [name] is 60 years old and was recently admitted to  [facility] on the hospitalist service with chest pain.  She had a  Myoview scan which showed an apical defect that was thought to most  probably represent attenuation, but because of this and persistent  symptoms and positive risk factors, she is scheduled for evaluation with  catheterization.  She also has a lung density there was noted on x-ray.    PROCEDURE:  The procedure was performed by the femoral arterial sheath  and 4-French preformed coronary catheters.  A front wall arterial  puncture was performed Omnipaque contrast was used.  After completion,  the patient  tolerated and left the laboratory in satisfactory condition.    RESULTS:  The aortic pressure was 127/71 with a mean of 92 and left  ventricular pressure was 127/10.    Left main coronary:  The left main coronary is a very short vessel, free  of disease.    Left anterior descending artery gave rise to two diagonal branches and  two septal perforators.  There was an eccentric plaque in the proximal  LAD just after the second diagonal branch which appeared to narrow the  lumen about 50%.  There was no calcification.    The circumflex artery gave rise to two marginal branches and two  posterolateral branches.  These vessels were free of significant  disease.    The right coronary artery was a moderate-sized vessel, gave rise to a  clonus branch and right ventricular branch, posterior descending branch  and two posterolateral branches.  These vessels were free of disease.    The left ventriculogram and was done in the RAO projection, showed good  wall motion with no areas of hypokinesis.  The estimated ejection  fraction 60%.    CONCLUSION:  Probable nonobstructive coronary artery disease with 50%  narrowing in the proximal left anterior descending,  no significant  obstruction in the circumflex and right coronary and normal left  ventricular function.    RECOMMENDATIONS:  The patient has a plaque of about 50% in the LAD.  All  the diameter narrowing is not severe.  The reference vessel lumen is  only about 2 mm.  This makes the lumen relatively small.  I think this  is probably nonobstructive but I cannot be 100% sure of this.  Her scan  showed apical defect that was thought mostly to be attenuation.  It was  thought probably to be attenuation rather than ischemia and his symptoms  have been  thought to be atypical.  I discussed the findings with Dr. Andee Lineman  and  will plan to start her on a calcium channel blocker.  If her CT has not  been scheduled, will schedule that to evaluate  her lung lesion with  followup then with Dr. Andee Lineman.          Bruce Elvera Lennox Juanda Chance, MD, [facility]  Electronically Signed        BRB/MEDQ  D:  01/30/2007  T:  01/30/2007  Job:  [id number]    cc:   Learta Codding, MD,FACC  Cardiac Cath Lab   Laboratory Data:  High Sensitivity Troponin:   Recent Labs  Lab 05/21/21 0713 05/21/21 0852 05/21/21 1110  TROPONINIHS 4 9 22*     Chemistry Recent Labs  Lab 05/21/21 0713  NA 137  K 3.7  CL 104  CO2 26  GLUCOSE 107*  BUN 14  CREATININE 0.74  CALCIUM 9.3  GFRNONAA >60  ANIONGAP 7    No results for input(s): PROT, ALBUMIN, AST, ALT, ALKPHOS, BILITOT in the last 168 hours. Lipids No results for input(s): CHOL, TRIG, HDL, LABVLDL, LDLCALC, CHOLHDL in the last 168 hours.  Hematology Recent Labs  Lab 05/21/21 0713  WBC 5.0  RBC 4.16  HGB 13.7  HCT 40.2  MCV 96.6  MCH 32.9  MCHC 34.1  RDW 12.4  PLT 354   Thyroid No results for input(s): TSH, FREET4 in the last 168 hours.  BNPNo results for input(s): BNP, PROBNP in the last 168 hours.  DDimer  Recent Labs  Lab 05/21/21 0713  DDIMER 0.31     Radiology/Studies:  [facility] Chest Port 1 View  Result Date: 05/21/2021 CLINICAL DATA:  60 year old female with history of chest pain radiating into the neck and left arm since this morning. Shortness of breath. EXAM: PORTABLE CHEST 1 VIEW COMPARISON:  No priors. FINDINGS: Lung volumes are normal. No consolidative airspace disease. No pleural effusions. No pneumothorax. No pulmonary nodule or mass noted. Pulmonary vasculature and the cardiomediastinal silhouette are within normal limits. Atherosclerosis in the thoracic aorta. IMPRESSION: 1.  No radiographic evidence of acute cardiopulmonary disease. 2. Aortic atherosclerosis. Electronically Signed   By: Trudie Reed M.D.   On: 05/21/2021 07:30     Assessment and Plan:   1. Chest pain with marginally elevated troponin, known history of mild CAD - symptoms are mixed atypical/typical with  minimal troponin uptrend, surprisingly still quite low despite severity of symptoms - CTA was done to r/o dissection given CP + neurologic symptoms: no evidence of dissection or PE  - d-dimer is negative - continue with ASA, statin - check lipids in AM - will order 2D echocardiogram - recommend [facility] tomorrow.  NPO after MN  2. Possible neurologic symptoms with dizziness, face tingling, left arm numbness - discussed with EDP, await their input regarding additional testing  3. History of pericardial cyst resection - noted   4. Hx of statin therapy for mild CAD - check lipids in AM   Risk Assessment/Risk Scores:     TIMI Risk Score for Unstable Angina or Non-ST Elevation MI:   The patient's TIMI risk score is 5, which indicates a 26% risk of all cause mortality, new or recurrent myocardial infarction or need for urgent revascularization in the next 14 days.   For questions or updates, please contact [facility] Please consult [email or web address] for contact info under    Signed, Laurann Montana, PA-C  05/21/2021 12:49 PM  Patient seen and examined.  Agree with above documentation.  Ms. Lessig is a 60 year old female  with a history of nonobstructive CAD, pericardial cyst status post resection, tobacco use who we are consulted by Dr. Charm Barges for evaluation of chest pain.  She follows with Dr. Diona Browner.  Underwent cardiac catheterization in 2008 for evaluation of chest pain, found to have nonobstructive CAD with 50% LAD stenosis, otherwise no significant abnormalities.  She was also found to have a pericardial cyst and underwent resection in 2010.  This morning she reports that she woke up with chest pain.  Reports that she woke up around 6 AM with pain that she described as sharp but also pressure-like.  Was 10 out of 10 pain.  Also noted left arm weakness, reports she could not lift her arm and felt dizzy.    Given these symptoms, presented to the ED.  In the ED, initial vital signs  notable for BP 139/84, pulse 100, SPO2 96% on room air.  Chest x-ray unremarkable.  CTA chest showed no evidence of aortic dissection or PE.  EKG shows sinus rhythm, rate 98, early R wave transition, less than 1 mm ST depressions in inferior leads, V5/6 (similar to prior EKG 09/04/2020).  Labs notable for creatinine 0.7, WBC 5.0, hemoglobin 13.7, platelets 354, D-dimer 0.3, troponin 4 > 9 > 22 > 19 > 14.  On exam, patient is alert and oriented, regular rate and rhythm, no murmurs, lungs CTAB, no LE edema or JVD.  For her chest pain, given continuous pain for hours with minimal troponin, do not suspect acute coronary syndrome.  She does have ST depressions on her EKG, but these were present on prior EKG as well.  CT shows no evidence of PE or aortic dissection.  Given her history of 50% LAD stenosis in 2008, recommend stress test for further evaluation.  Will plan for tomorrow.  Little Ishikawa, MD

## 2021-05-21 NOTE — Assessment & Plan Note (Signed)
Continue Crestor 

## 2021-05-21 NOTE — H&P (Signed)
History and Physical    [name] [name] EXH:[id number] DOB: Dec 12, 1960 DOA: 05/21/2021  PCP: [name]   Patient coming from: Home  I have personally briefly reviewed patient's old medical records in [facility]  CC: chest pain HPI: 60 year old female with a history of hyperlipidemia presents to the ER today with a sudden onset of chest pain starting around 6 AM.  She states that this woke her up from sleep.  She had chest pain over the left side of her breast.  This radiated to the left side of her neck and down her left arm.  It did make her short of breath.  Denies any nausea vomiting or diaphoresis.  Patient states that over the last 6 weeks, she has noticed increasing fatigue.  She gets short of breath even with simple activities of daily living.  She states that she needs to rest when she walks up the stairs.  She also has to rest when she walks from her job out to the parking lot to her car.  She never has any chest pain with these walking episodes but does get extremely short winded.  Patient was a prior smoker.  She has not been diagnosed with COPD.  She is quit smoking.  Her chest pain lasted about 40 minutes.  She was taken to the ER by EMS.  She was given nitroglycerin spray aspirin and morphine.  She seems to think that all of 3 things seem to help her.  She does have a headache now.  Patient is chest pain-free.  Due to the severity of her symptoms, cardiology's been consulted.  They want the patient be admitted to hospital.  They will decide on cardiac stress testing strategy tomorrow.   ED Course: received NTG, ASA, IV morphine en route. Chest pain free after arrival. Troponin slightly elevated and rising.  Review of Systems:  Review of Systems  Constitutional: Negative.   HENT: Negative.    Eyes: Negative.   Respiratory:  Positive for shortness of breath.        Increased DOE  Cardiovascular:  Positive for chest pain.       SSCP with radiation to left side  of neck and down left arm  Gastrointestinal: Negative.   Genitourinary: Negative.   Musculoskeletal: Negative.   Skin: Negative.   Neurological:  Positive for weakness.  Endo/Heme/Allergies: Negative.   Psychiatric/Behavioral: Negative.    All other systems reviewed and are negative.  Past Medical History:  Diagnosis Date   Anxiety and depression    CAD (coronary artery disease)    Nonobstructive 2008    GERD (gastroesophageal reflux disease)    Irritable bowel syndrome    Migraine headache    Pericardial cyst    Resected 2010    Recurrent chest pain     Past Surgical History:  Procedure Laterality Date   BREAST BIOPSY Left    Left VAT with resection of pericardial cyst  November 2010   [name]   PARTIAL HYSTERECTOMY       reports that she quit smoking about 9 years ago. Her smoking use included cigarettes. She has never used smokeless tobacco. She reports that she does not drink alcohol and does not use drugs.  Allergies  Allergen Reactions   Ciprofloxacin Diarrhea    c-diff per patient    Penicillins     REACTION: rash   Sulfamethoxazole-Trimethoprim     REACTION: SOB   Tramadol Hcl     REACTION:  facial swelling    Family History  Problem Relation Age of Onset   Lung cancer Mother    Transient ischemic attack Mother    COPD Mother    Hypertension Mother     Prior to Admission medications   Medication Sig Start Date End Date Taking? Authorizing Provider  aspirin EC 81 MG tablet Take 1 tablet (81 mg total) by mouth daily. 06/01/18  Yes [name], MD  ibandronate (BONIVA) 150 MG tablet Take 150 mg by mouth every 30 (thirty) days. Take in the morning with a full glass of water, on an empty stomach, and do not take anything else by mouth or lie down for the next 30 min.   Yes [provider]  rosuvastatin (CRESTOR) 5 MG tablet TAKE 1 TABLET BY MOUTH DAILY Patient taking differently: Take 5 mg by mouth daily. 01/22/21  Yes [name], MD  vitamin B-12 (CYANOCOBALAMIN) 1000 MCG tablet Take 1,000 mcg by mouth daily.   Yes [provider]    Physical Exam: Vitals:   05/21/21 1100 05/21/21 1130 05/21/21 1200 05/21/21 1230  BP: 128/76 119/71 116/70 98/69  Pulse: 70 67 74 73  Resp: 16 13 13  (!) 21  Temp:      TempSrc:      SpO2: 95% 93% 95% 99%  Weight:      Height:        Physical Exam Vitals and nursing note reviewed.  Constitutional:      General: She is not in acute distress.    Appearance: Normal appearance. She is normal weight. She is not ill-appearing, toxic-appearing or diaphoretic.  HENT:     Head: Normocephalic and atraumatic.     Nose: Nose normal. No rhinorrhea.  Eyes:     General:        Right eye: No discharge.        Left eye: No discharge.     Pupils: Pupils are equal, round, and reactive to light.  Cardiovascular:     Rate and Rhythm: Normal rate and regular rhythm.     Pulses: Normal pulses.     Heart sounds: No murmur heard. Pulmonary:     Effort: Pulmonary effort is normal. No respiratory distress.     Breath sounds: Normal breath sounds. No wheezing or rales.  Abdominal:     General: Abdomen is flat. Bowel sounds are normal. There is no distension.     Tenderness: There is no abdominal tenderness. There is no guarding or rebound.  Musculoskeletal:     Right lower leg: No edema.     Left lower leg: No edema.  Skin:    General: Skin is warm and dry.     Capillary Refill: Capillary refill takes less than 2 seconds.  Neurological:     General: No focal deficit present.     Mental Status: She is alert.     Labs on Admission: I have personally reviewed following labs and imaging studies  CBC: Recent Labs  Lab 05/21/21 0713  WBC 5.0  HGB 13.7  HCT 40.2  MCV 96.6  PLT 354   Basic Metabolic Panel: Recent Labs  Lab 05/21/21 0713  NA 137  K 3.7  CL 104  CO2 26  GLUCOSE 107*  BUN 14  CREATININE 0.74  CALCIUM 9.3   GFR: Estimated Creatinine Clearance: 71.2  mL/min (by C-G formula based on SCr of 0.74 mg/dL). Liver Function Tests: No results for input(s): AST, ALT, ALKPHOS, BILITOT, PROT,  ALBUMIN in the last 168 hours. No results for input(s): LIPASE, AMYLASE in the last 168 hours. No results for input(s): AMMONIA in the last 168 hours. Coagulation Profile: No results for input(s): INR, PROTIME in the last 168 hours. Cardiac Enzymes: No results for input(s): CKTOTAL, CKMB, CKMBINDEX, TROPONINI in the last 168 hours. BNP (last 3 results) No results for input(s): PROBNP in the last 8760 hours. HbA1C: No results for input(s): HGBA1C in the last 72 hours. CBG: No results for input(s): GLUCAP in the last 168 hours. Lipid Profile: No results for input(s): CHOL, HDL, LDLCALC, TRIG, CHOLHDL, LDLDIRECT in the last 72 hours. Thyroid Function Tests: No results for input(s): TSH, T4TOTAL, FREET4, T3FREE, THYROIDAB in the last 72 hours. Anemia Panel: No results for input(s): VITAMINB12, FOLATE, FERRITIN, TIBC, IRON, RETICCTPCT in the last 72 hours. Urine analysis:    Component Value Date/Time   COLORURINE YELLOW 06/22/2009 1546   APPEARANCEUR CLOUDY (A) 06/22/2009 1546   LABSPEC 1.022 06/22/2009 1546   PHURINE 7.0 06/22/2009 1546   GLUCOSEU NEGATIVE 06/22/2009 1546   HGBUR NEGATIVE 06/22/2009 1546   BILIRUBINUR NEGATIVE 06/22/2009 1546   KETONESUR NEGATIVE 06/22/2009 1546   PROTEINUR NEGATIVE 06/22/2009 1546   UROBILINOGEN 1.0 06/22/2009 1546   NITRITE NEGATIVE 06/22/2009 1546   LEUKOCYTESUR TRACE (A) 06/22/2009 1546    Radiological Exams on Admission: I have personally reviewed images DG Chest Port 1 View  Result Date: 05/21/2021 CLINICAL DATA:  60 year old female with history of chest pain radiating into the neck and left arm since this morning. Shortness of breath. EXAM: PORTABLE CHEST 1 VIEW COMPARISON:  No priors. FINDINGS: Lung volumes are normal. No consolidative airspace disease. No pleural effusions. No pneumothorax. No pulmonary  nodule or mass noted. Pulmonary vasculature and the cardiomediastinal silhouette are within normal limits. Atherosclerosis in the thoracic aorta. IMPRESSION: 1.  No radiographic evidence of acute cardiopulmonary disease. 2. Aortic atherosclerosis. Electronically Signed   By: Trudie Reed M.D.   On: 05/21/2021 07:30   CT ANGIO CHEST AORTA W/CM & OR WO/CM  Result Date: 05/21/2021 CLINICAL DATA:  Chest and left arm pain, numbness since this morning EXAM: CT ANGIOGRAPHY CHEST WITH CONTRAST TECHNIQUE: Multidetector CT imaging of the chest was performed using the standard protocol during bolus administration of intravenous contrast. Multiplanar CT image reconstructions and MIPs were obtained to evaluate the vascular anatomy. CONTRAST:  67mL OMNIPAQUE IOHEXOL 350 MG/ML SOLN COMPARISON:  05/21/2021 FINDINGS: Cardiovascular: No evidence of thoracic aortic aneurysm or dissection. Mild atherosclerosis of the aortic arch and descending thoracic aorta. The heart is unremarkable without pericardial effusion. While not optimized for opacification of the pulmonary vasculature, there is sufficient contrast enhancement to exclude pulmonary emboli. No filling defects. Mediastinum/Nodes: No enlarged mediastinal, hilar, or axillary lymph nodes. Thyroid gland, trachea, and esophagus demonstrate no significant findings. Lungs/Pleura: Upper lobe predominant emphysema, with a large bulla at the left apex. No acute airspace disease, effusion, or pneumothorax. Central airways are patent. Upper Abdomen: No acute abnormality. Musculoskeletal: No acute or destructive bony lesions. Reconstructed imaging demonstrates no additional findings. Review of the MIP images confirms the above findings. IMPRESSION: 1. No evidence of thoracic aortic aneurysm or dissection. 2. No acute pulmonary embolus. 3. Aortic Atherosclerosis (ICD10-I70.0) and Emphysema (ICD10-J43.9). Electronically Signed   By: Sharlet Salina M.D.   On: 05/21/2021 15:24     EKG: I have personally reviewed EKG: NSR with diffuse ST depression II, III, AVF, lateral precordial leads 4, 5, 6    Assessment/Plan Principal Problem:  Chest pain Active Problems:   Hyperlipidemia   Emphysema lung (HCC)    Chest pain Admit to observation status.  Serial troponins.  Cardiology is reducing the patient.  They requested keep patient NPO.  They will decide whether or not the patient needs a stress test versus cardiac cath tomorrow.  Continue aspirin.  Continue statin.  Hyperlipidemia Continue Crestor.  Emphysema lung (HCC) Patient former smoker.  She has emphysematous changes on her CT scan of her chest.  She has a large left apical bleb.  She would benefit from seeing pulmonology as an outpatient.  If her shortness of breath is not from a cardiac cause, most likely this is due to her undiagnosed COPD.  DVT prophylaxis: Lovenox Code Status: Full Code Family Communication: discussed with pt and her significant other Maylon Cos, at bedside Disposition Plan: return home  Consults called: cardiology consulted by EDP  Admission status: Observation, Telemetry bed   Carollee Herter, DO [facility] 05/21/2021, 4:52 PM

## 2021-05-21 NOTE — Assessment & Plan Note (Signed)
Patient former smoker.  She has emphysematous changes on her CT scan of her chest.  She has a large left apical bleb.  She would benefit from seeing pulmonology as an outpatient.  If her shortness of breath is not from a cardiac cause, most likely this is due to her undiagnosed COPD.

## 2021-05-21 NOTE — ED Provider Notes (Signed)
[facility] EMERGENCY DEPARTMENT Provider Note   CSN: [id number] Arrival date & time: 05/21/21  2841     History Chief Complaint  Patient presents with   Chest Pain    [name] [name] is a 60 y.o. female.  She is here with substernal chest pain radiating to her neck and arm that awoke her from sleep at 6 AM this morning.  Rates it as 10 out of 10 sharp and squeezing.  No diaphoresis but does feel little nauseous and short of breath.  No recent illness.  Follows with Dr. Diona Browner cardiology.  Takes daily aspirin.  Has not tried anything for this.  The history is provided by the patient.  Chest Pain Pain location:  Substernal area Pain quality: pressure and stabbing   Pain radiates to:  Neck and L arm Pain severity:  Severe Onset quality:  Sudden Duration:  1 hour Timing:  Constant Progression:  Unchanged Chronicity:  New Context: at rest   Relieved by:  None tried Worsened by:  Nothing Ineffective treatments:  None tried Associated symptoms: nausea and shortness of breath   Associated symptoms: no abdominal pain, no back pain, no cough, no diaphoresis, no fever, no headache and no vomiting   Risk factors: coronary artery disease   Risk factors: no high cholesterol and no smoking       Past Medical History:  Diagnosis Date   Anxiety and depression    CAD (coronary artery disease)    Nonobstructive 2008    GERD (gastroesophageal reflux disease)    Irritable bowel syndrome    Migraine headache    Pericardial cyst    Resected 2010    Recurrent chest pain     Patient Active Problem List   Diagnosis Date Noted   Chest pain 05/21/2021   Emphysema lung (HCC) 05/21/2021   Hyperlipidemia 07/26/2010   ANXIETY DEPRESSION 06/12/2010   OTHER SPECIFIED DISEASES OF PERICARDIUM 06/12/2010   CAD, NATIVE VESSEL 04/10/2009   CHEST PAIN-UNSPECIFIED 04/07/2009    Past Surgical History:  Procedure Laterality Date   BREAST BIOPSY Left    Left VAT with resection of pericardial  cyst  November 2010   [name]   PARTIAL HYSTERECTOMY       OB History   No obstetric history on file.     Family History  Problem Relation Age of Onset   Lung cancer Mother    Transient ischemic attack Mother    COPD Mother    Hypertension Mother     Social History   Tobacco Use   Smoking status: Former    Types: Cigarettes    Quit date: 08/13/2011    Years since quitting: 9.7   Smokeless tobacco: Never   Tobacco comments:    Smoked for 20-some years  Vaping Use   Vaping Use: Never used  Substance Use Topics   Alcohol use: No    Comment: rare glass of wine   Drug use: No    Home Medications Prior to Admission medications   Medication Sig Start Date End Date Taking? Authorizing Provider  aspirin EC 81 MG tablet Take 1 tablet (81 mg total) by mouth daily. 06/01/18  Yes [name], MD  ibandronate (BONIVA) 150 MG tablet Take 150 mg by mouth every 30 (thirty) days. Take in the morning with a full glass of water, on an empty stomach, and do not take anything else by mouth or lie down for the next 30 min.   Yes [provider]  rosuvastatin (CRESTOR) 5 MG tablet TAKE 1 TABLET BY MOUTH DAILY Patient taking differently: Take 5 mg by mouth daily. 01/22/21  Yes [name], MD  vitamin B-12 (CYANOCOBALAMIN) 1000 MCG tablet Take 1,000 mcg by mouth daily.   Yes [provider]    Allergies    Ciprofloxacin, Penicillins, Sulfamethoxazole-trimethoprim, and Tramadol hcl  Review of Systems   Review of Systems  Constitutional:  Negative for diaphoresis and fever.  HENT:  Negative for sore throat.   Eyes:  Negative for visual disturbance.  Respiratory:  Positive for shortness of breath. Negative for cough.   Cardiovascular:  Positive for chest pain.  Gastrointestinal:  Positive for nausea. Negative for abdominal pain and vomiting.  Genitourinary:  Negative for dysuria.  Musculoskeletal:  Negative for back pain.  Skin:  Negative for rash.   Neurological:  Negative for headaches.   Physical Exam Updated Vital Signs BP (!) 117/56 (BP Location: Left Arm)   Pulse 85   Temp 97.8 F (36.6 C) (Oral)   Resp 19   Ht 5\' 3"  (1.6 m)   Wt 70.1 kg   SpO2 97%   BMI 27.38 kg/m   Physical Exam Vitals and nursing note reviewed.  Constitutional:      General: She is not in acute distress.    Appearance: She is well-developed.  HENT:     Head: Normocephalic and atraumatic.  Eyes:     Conjunctiva/sclera: Conjunctivae normal.  Cardiovascular:     Rate and Rhythm: Normal rate and regular rhythm.     Heart sounds: Normal heart sounds. No murmur heard. Pulmonary:     Effort: Pulmonary effort is normal. No respiratory distress.     Breath sounds: Normal breath sounds.  Abdominal:     Palpations: Abdomen is soft.     Tenderness: There is no abdominal tenderness.  Musculoskeletal:        General: Normal range of motion.     Cervical back: Neck supple.     Right lower leg: No tenderness. No edema.     Left lower leg: No tenderness. No edema.  Skin:    General: Skin is warm and dry.  Neurological:     General: No focal deficit present.     Mental Status: She is alert.    ED Results / Procedures / Treatments   Labs (all labs ordered are listed, but only abnormal results are displayed) Labs Reviewed  BASIC METABOLIC PANEL - Abnormal; Notable for the following components:      Result Value   Glucose, Bld 107 (*)    All other components within normal limits  TROPONIN I (HIGH SENSITIVITY) - Abnormal; Notable for the following components:   Troponin I (High Sensitivity) 22 (*)    All other components within normal limits  TROPONIN I (HIGH SENSITIVITY) - Abnormal; Notable for the following components:   Troponin I (High Sensitivity) 19 (*)    All other components within normal limits  RESP PANEL BY RT-PCR (FLU A&B, COVID) ARPGX2  CBC  D-DIMER, QUANTITATIVE  LIPID PANEL  HIV ANTIBODY (ROUTINE TESTING W REFLEX)  TROPONIN I  (HIGH SENSITIVITY)  TROPONIN I (HIGH SENSITIVITY)  TROPONIN I (HIGH SENSITIVITY)    EKG EKG Interpretation  Date/Time:  Monday May 21 2021 07:05:55 EDT Ventricular Rate:  98 PR Interval:  122 QRS Duration: 93 QT Interval:  342 QTC Calculation: 437 R Axis:   38 Text Interpretation: Sinus rhythm Posterior infarct, old Borderline repolarization abnormality diffuse ST  depression new from prior 11/10 Confirmed by Meridee Score [phone]) on 05/21/2021 7:12:23 AM  Radiology DG Chest Port 1 View  Result Date: 05/21/2021 CLINICAL DATA:  60 year old female with history of chest pain radiating into the neck and left arm since this morning. Shortness of breath. EXAM: PORTABLE CHEST 1 VIEW COMPARISON:  No priors. FINDINGS: Lung volumes are normal. No consolidative airspace disease. No pleural effusions. No pneumothorax. No pulmonary nodule or mass noted. Pulmonary vasculature and the cardiomediastinal silhouette are within normal limits. Atherosclerosis in the thoracic aorta. IMPRESSION: 1.  No radiographic evidence of acute cardiopulmonary disease. 2. Aortic atherosclerosis. Electronically Signed   By: Trudie Reed M.D.   On: 05/21/2021 07:30   CT ANGIO CHEST AORTA W/CM & OR WO/CM  Result Date: 05/21/2021 CLINICAL DATA:  Chest and left arm pain, numbness since this morning EXAM: CT ANGIOGRAPHY CHEST WITH CONTRAST TECHNIQUE: Multidetector CT imaging of the chest was performed using the standard protocol during bolus administration of intravenous contrast. Multiplanar CT image reconstructions and MIPs were obtained to evaluate the vascular anatomy. CONTRAST:  57mL OMNIPAQUE IOHEXOL 350 MG/ML SOLN COMPARISON:  05/21/2021 FINDINGS: Cardiovascular: No evidence of thoracic aortic aneurysm or dissection. Mild atherosclerosis of the aortic arch and descending thoracic aorta. The heart is unremarkable without pericardial effusion. While not optimized for opacification of the pulmonary vasculature, there  is sufficient contrast enhancement to exclude pulmonary emboli. No filling defects. Mediastinum/Nodes: No enlarged mediastinal, hilar, or axillary lymph nodes. Thyroid gland, trachea, and esophagus demonstrate no significant findings. Lungs/Pleura: Upper lobe predominant emphysema, with a large bulla at the left apex. No acute airspace disease, effusion, or pneumothorax. Central airways are patent. Upper Abdomen: No acute abnormality. Musculoskeletal: No acute or destructive bony lesions. Reconstructed imaging demonstrates no additional findings. Review of the MIP images confirms the above findings. IMPRESSION: 1. No evidence of thoracic aortic aneurysm or dissection. 2. No acute pulmonary embolus. 3. Aortic Atherosclerosis (ICD10-I70.0) and Emphysema (ICD10-J43.9). Electronically Signed   By: Sharlet Salina M.D.   On: 05/21/2021 15:24    Procedures Procedures   Medications Ordered in ED Medications  nitroGLYCERIN (NITROSTAT) SL tablet 0.4 mg (0.4 mg Sublingual Given 05/21/21 0737)  aspirin tablet 325 mg (has no administration in time range)  rosuvastatin (CRESTOR) tablet 5 mg (5 mg Oral Given 05/21/21 2015)  enoxaparin (LOVENOX) injection 40 mg (40 mg Subcutaneous Given 05/21/21 1849)  acetaminophen (TYLENOL) tablet 650 mg (has no administration in time range)    Or  acetaminophen (TYLENOL) suppository 650 mg (has no administration in time range)  ondansetron (ZOFRAN) tablet 4 mg (has no administration in time range)    Or  ondansetron (ZOFRAN) injection 4 mg (has no administration in time range)  melatonin tablet 10 mg (has no administration in time range)  aspirin chewable tablet 324 mg (324 mg Oral Given 05/21/21 0721)  morphine 4 MG/ML injection 4 mg (4 mg Intravenous Given 05/21/21 0723)  alum & mag hydroxide-simeth (MAALOX/MYLANTA) 200-200-20 MG/5ML suspension 30 mL (30 mLs Oral Given 05/21/21 1039)    And  lidocaine (XYLOCAINE) 2 % viscous mouth solution 15 mL (15 mLs Oral Given  05/21/21 1039)  iohexol (OMNIPAQUE) 350 MG/ML injection 75 mL (75 mLs Intravenous Contrast Given 05/21/21 1457)  acetaminophen (TYLENOL) tablet 1,000 mg (1,000 mg Oral Given 05/21/21 1627)    ED Course  I have reviewed the triage vital signs and the nursing notes.  Pertinent labs & imaging results that were available during my care of the patient were reviewed by  me and considered in my medical decision making (see chart for details).  Clinical Course as of 05/21/21 2102  Mon May 21, 2021  2376 Chest x-ray interpreted by me as no acute pulmonary disease.  Awaiting radiology reading. [MB]  [phone] Patient states her symptoms are improved although not completely resolved.  Troponins minimally rising. [MB]  1241 Patient was seen by cardiology consultant.  They are recommending a CT dissection study of the chest.  If this is negative she should be admitted to the hospital for rule out.  If tropes are rising anticipate may need a cath.  Recommend hold off on heparin for now. [MB]    Clinical Course User Index [MB] Terrilee Files, MD   MDM Rules/Calculators/A&P                          This patient complains of substernal chest pain radiating to left shoulder and arm and neck; this involves an extensive number of treatment Options and is a complaint that carries with it a high risk of complications and Morbidity. The differential includes ACS, pneumonia, PE, pneumothorax, GERD, musculoskeletal  I ordered, reviewed and interpreted labs, which included CBC with normal white count normal hemoglobin, chemistries fairly normal, troponins rising, COVID-negative, D-dimer negative I ordered medication aspirin morphine and nitroglycerin with improvement in her symptoms I ordered imaging studies which included chest x-ray and CT angio chest and I independently    visualized and interpreted imaging which showed no acute findings Additional history obtained from patient's husband Previous records  obtained and reviewed in epic including prior cardiology notes I consulted [facility] cardiology Dr. Bjorn Pippin and [facility] Dr. Imogene Burn and discussed lab and imaging findings  Critical Interventions: None  After the interventions stated above, I reevaluated the patient and found patient to be symptomatically improved.  Heart score of 5.  Cardiology recommending admission to the hospital for stress test tomorrow.   Final Clinical Impression(s) / ED Diagnoses Final diagnoses:  Nonspecific chest pain    Rx / DC Orders ED Discharge Orders     None        Terrilee Files, MD 05/21/21 2106

## 2021-05-21 NOTE — ED Triage Notes (Signed)
Pt c/o left chest pain that radiates into neck and left arm that started this morning at 0600. Pt also c/o SOB, nausea and some dizziness.

## 2021-05-21 NOTE — Discharge Instructions (Signed)
You were seen in the emergency department for chest pain.  You had blood work EKG and a chest x-ray that did not show any definite signs of heart attack.  Please continue your regular medications including aspirin.  Call your cardiologist tomorrow for close follow-up.  Return to the emergency department if any worsening or concerning symptoms.

## 2021-05-21 NOTE — Subjective & Objective (Signed)
CC: chest pain HPI: 60 year old female with a history of hyperlipidemia presents to the ER today with a sudden onset of chest pain starting around 6 AM.  She states that this woke her up from sleep.  She had chest pain over the left side of her breast.  This radiated to the left side of her neck and down her left arm.  It did make her short of breath.  Denies any nausea vomiting or diaphoresis.  Patient states that over the last 6 weeks, she has noticed increasing fatigue.  She gets short of breath even with simple activities of daily living.  She states that she needs to rest when she walks up the stairs.  She also has to rest when she walks from her job out to the parking lot to her car.  She never has any chest pain with these walking episodes but does get extremely short winded.  Patient was a prior smoker.  She has not been diagnosed with COPD.  She is quit smoking.  Her chest pain lasted about 40 minutes.  She was taken to the ER by EMS.  She was given nitroglycerin spray aspirin and morphine.  She seems to think that all of 3 things seem to help her.  She does have a headache now.  Patient is chest pain-free.  Due to the severity of her symptoms, cardiology's been consulted.  They want the patient be admitted to hospital.  They will decide on cardiac stress testing strategy tomorrow.

## 2021-05-21 NOTE — Assessment & Plan Note (Signed)
Admit to observation status.  Serial troponins.  Cardiology is reducing the patient.  They requested keep patient NPO.  They will decide whether or not the patient needs a stress test versus cardiac cath tomorrow.  Continue aspirin.  Continue statin.

## 2021-05-22 ENCOUNTER — Observation Stay (HOSPITAL_BASED_OUTPATIENT_CLINIC_OR_DEPARTMENT_OTHER): Payer: 59

## 2021-05-22 DIAGNOSIS — R071 Chest pain on breathing: Secondary | ICD-10-CM

## 2021-05-22 DIAGNOSIS — E782 Mixed hyperlipidemia: Secondary | ICD-10-CM | POA: Diagnosis not present

## 2021-05-22 DIAGNOSIS — R079 Chest pain, unspecified: Secondary | ICD-10-CM

## 2021-05-22 DIAGNOSIS — E781 Pure hyperglyceridemia: Secondary | ICD-10-CM

## 2021-05-22 LAB — ECHOCARDIOGRAM COMPLETE
AR max vel: 2.54 cm2
AV Area VTI: 2.47 cm2
AV Area mean vel: 2.13 cm2
AV Mean grad: 3 mmHg
AV Peak grad: 4.8 mmHg
Ao pk vel: 1.09 m/s
Area-P 1/2: 3.01 cm2
Calc EF: 53.2 %
Height: 63 in
MV VTI: 3.09 cm2
S' Lateral: 2.5 cm
Single Plane A2C EF: 49.2 %
Single Plane A4C EF: 55.1 %
Weight: 2472.68 oz

## 2021-05-22 LAB — LIPID PANEL
Cholesterol: 150 mg/dL (ref 0–200)
HDL: 65 mg/dL (ref >40)
LDL Cholesterol: 74 mg/dL (ref 0–99)
Total CHOL/HDL Ratio: 2.3 RATIO
Triglycerides: 57 mg/dL (ref <150)
VLDL: 11 mg/dL (ref 0–40)

## 2021-05-22 LAB — NM MYOCAR MULTI W/SPECT W/WALL MOTION / EF
Base ST Depression (mm): 0 mm
LV dias vol: 50 mL (ref 46–106)
LV sys vol: 18 mL
Nuc Stress EF: 63 %
Peak HR: 125 {beats}/min
RATE: 0.5
Rest HR: 83 {beats}/min
Rest Nuclear Isotope Dose: 10 mCi
SDS: 3
SRS: 0
SSS: 3
ST Depression (mm): 0 mm
Stress Nuclear Isotope Dose: 31 mCi
TID: 1.04

## 2021-05-22 LAB — TSH: TSH: 4.103 u[IU]/mL (ref 0.350–4.500)

## 2021-05-22 LAB — HIV ANTIBODY (ROUTINE TESTING W REFLEX): HIV Screen 4th Generation wRfx: NONREACTIVE

## 2021-05-22 MED ORDER — ROSUVASTATIN CALCIUM 20 MG PO TABS: 20.0000 mg | ORAL_TABLET | Freq: Every day | ORAL | 0 refills | Status: AC

## 2021-05-22 MED ORDER — SODIUM CHLORIDE FLUSH 0.9 % IV SOLN
INTRAVENOUS | Status: AC
  Administered 2021-05-22: 10 mL via INTRAVENOUS
  Filled 2021-05-22: qty 10

## 2021-05-22 MED ORDER — MELATONIN 3 MG PO TABS: 9.0000 mg | ORAL_TABLET | Freq: Every evening | ORAL | Status: DC | PRN

## 2021-05-22 MED ORDER — TECHNETIUM TC 99M TETROFOSMIN IV KIT
10.0000 | PACK | Freq: Once | INTRAVENOUS | Status: AC | PRN
  Administered 2021-05-22: 10 via INTRAVENOUS

## 2021-05-22 MED ORDER — TECHNETIUM TC 99M TETROFOSMIN IV KIT
30.0000 | PACK | Freq: Once | INTRAVENOUS | Status: AC | PRN
  Administered 2021-05-22: 31 via INTRAVENOUS

## 2021-05-22 MED ORDER — REGADENOSON 0.4 MG/5ML IV SOLN
INTRAVENOUS | Status: AC
  Administered 2021-05-22: 0.4 mg via INTRAVENOUS
  Filled 2021-05-22: qty 5

## 2021-05-22 NOTE — Progress Notes (Signed)
NST and echo reassuring. Per d/w Dr. Anne Fu, OK for dc. We have notified IM team. I tried to call into patient's room to discuss but did not get an answer. I asked Dr. Rhona Leavens to help relay result to patient. We will otherwise plan to see her in f/u 06/05/21 as scheduled.

## 2021-05-22 NOTE — Discharge Summary (Signed)
Physician Discharge Summary  [name]:[id number] DOB: April 02, 1961 DOA: 05/21/2021  PCP: [name]  Admit date: 05/21/2021 Discharge date: 05/22/2021  Admitted From: Home Disposition:  Home  Recommendations for Outpatient Follow-up:  Follow up with PCP in 1-2 weeks Follow up with Cardiology as scheduled Consider referral to Pulmonary for PFT testing to rule out underlying copd  Discharge Condition:Stable CODE STATUS:Full Diet recommendation: Heart healthy   Brief/Interim Summary: 60 year old female with a history of hyperlipidemia presents to the ER today with a sudden onset of chest pain starting around 6 AM.  She states that this woke her up from sleep.  She had chest pain over the left side of her breast.  This radiated to the left side of her neck and down her left arm.  It did make her short of breath.  Denies any nausea vomiting or diaphoresis.  Patient states that over the last 6 weeks, she has noticed increasing fatigue.  She gets short of breath even with simple activities of daily living.  She states that she needs to rest when she walks up the stairs.  She also has to rest when she walks from her job out to the parking lot to her car.  She never has any chest pain with these walking episodes but does get extremely short winded.  Patient was a prior smoker.  She has not been diagnosed with COPD.   Discharge Diagnoses:  Principal Problem:   Chest pain Active Problems:   Hyperlipidemia   Emphysema lung (HCC)   Chest pain Cardiology was consulted Pt underwent neg stress testing 2d echo was reviewed, unremarkable with no WMA and normal LVEF Discussed with Cardiology, OK to d/c home   Hyperlipidemia Continue Crestor with dose increased to 20mg  per Cardiology recs   Possible Emphysema lung [facility]) Patient former smoker.  She has emphysematous changes on her CT scan of her chest.  She has a large left apical bleb.  She would benefit from seeing pulmonology as  an outpatient.  If her shortness of breath is not from a cardiac cause, most likely this is due to her undiagnosed COPD. Recommend outpatient PFT's and pulm referral    Discharge Instructions   Allergies as of 05/22/2021       Reactions   Ciprofloxacin Diarrhea   c-diff per patient    Penicillins    REACTION: rash   Sulfamethoxazole-trimethoprim    REACTION: SOB   Tramadol Hcl    REACTION: facial swelling        Medication List     TAKE these medications    aspirin EC 81 MG tablet Take 1 tablet (81 mg total) by mouth daily.   ibandronate 150 MG tablet Commonly known as: BONIVA Take 150 mg by mouth every 30 (thirty) days. Take in the morning with a full glass of water, on an empty stomach, and do not take anything else by mouth or lie down for the next 30 min.   rosuvastatin 20 MG tablet Commonly known as: Crestor Take 1 tablet (20 mg total) by mouth daily. What changed:  medication strength how much to take   vitamin B-12 1000 MCG tablet Commonly known as: CYANOCOBALAMIN Take 1,000 mcg by mouth daily.        Follow-up Information     [name], NP Follow up.   Specialty: Cardiology Why: [facility] [place] location - cardiology follow-up on Tuesday Jun 05, 2021 11:30 AM (Arrive by 11:15 AM). [name] is  one of the nurse practitioners that works with Dr. Diona Browner. Contact information: [address] [place] [address] [phone]         [name] Follow up in 2 week(s).   Specialty: Nurse Practitioner Why: Hospital follow up Contact information: [address] [place] [address] [phone]         [name], MD .   Specialty: Cardiology Contact information: [address] [name] [address] [phone]                Allergies  Allergen Reactions   Ciprofloxacin Diarrhea    c-diff per patient    Penicillins     REACTION: rash   Sulfamethoxazole-Trimethoprim     REACTION:  SOB   Tramadol Hcl     REACTION: facial swelling    Consultations: Cardiology  Procedures/Studies: NM Myocar Multi W/Spect W/Wall Motion / EF  Result Date: 05/22/2021   The study is normal. The study is low risk.   No ST deviation was noted.   Images are interpretable but effected by bowel loop attenuation in the inferior lateral distribution (rest > stress)   LV perfusion is normal. There is no evidence of ischemia. There is no evidence of infarction.   Left ventricular function is normal. End diastolic cavity size is normal. End systolic cavity size is normal. No evidence of transient ischemic dilation (TID) noted.   DG Chest Port 1 View  Result Date: 05/21/2021 CLINICAL DATA:  60 year old female with history of chest pain radiating into the neck and left arm since this morning. Shortness of breath. EXAM: PORTABLE CHEST 1 VIEW COMPARISON:  No priors. FINDINGS: Lung volumes are normal. No consolidative airspace disease. No pleural effusions. No pneumothorax. No pulmonary nodule or mass noted. Pulmonary vasculature and the cardiomediastinal silhouette are within normal limits. Atherosclerosis in the thoracic aorta. IMPRESSION: 1.  No radiographic evidence of acute cardiopulmonary disease. 2. Aortic atherosclerosis. Electronically Signed   By: Trudie Reed M.D.   On: 05/21/2021 07:30   CT ANGIO CHEST AORTA W/CM & OR WO/CM  Result Date: 05/21/2021 CLINICAL DATA:  Chest and left arm pain, numbness since this morning EXAM: CT ANGIOGRAPHY CHEST WITH CONTRAST TECHNIQUE: Multidetector CT imaging of the chest was performed using the standard protocol during bolus administration of intravenous contrast. Multiplanar CT image reconstructions and MIPs were obtained to evaluate the vascular anatomy. CONTRAST:  65mL OMNIPAQUE IOHEXOL 350 MG/ML SOLN COMPARISON:  05/21/2021 FINDINGS: Cardiovascular: No evidence of thoracic aortic aneurysm or dissection. Mild atherosclerosis of the aortic arch and  descending thoracic aorta. The heart is unremarkable without pericardial effusion. While not optimized for opacification of the pulmonary vasculature, there is sufficient contrast enhancement to exclude pulmonary emboli. No filling defects. Mediastinum/Nodes: No enlarged mediastinal, hilar, or axillary lymph nodes. Thyroid gland, trachea, and esophagus demonstrate no significant findings. Lungs/Pleura: Upper lobe predominant emphysema, with a large bulla at the left apex. No acute airspace disease, effusion, or pneumothorax. Central airways are patent. Upper Abdomen: No acute abnormality. Musculoskeletal: No acute or destructive bony lesions. Reconstructed imaging demonstrates no additional findings. Review of the MIP images confirms the above findings. IMPRESSION: 1. No evidence of thoracic aortic aneurysm or dissection. 2. No acute pulmonary embolus. 3. Aortic Atherosclerosis (ICD10-I70.0) and Emphysema (ICD10-J43.9). Electronically Signed   By: Sharlet Salina M.D.   On: 05/21/2021 15:24   ECHOCARDIOGRAM COMPLETE  Result Date: 05/22/2021    ECHOCARDIOGRAM REPORT   Patient Name:   [name] [name] Half Date of Exam: 05/22/2021 Medical Rec #:  [id number]      Height:       63.0 in Accession #:    [id number]     Weight:       154.5 lb Date of Birth:  06-01-1961      BSA:          1.733 m Patient Age:    60 years       BP:           110/69 mmHg Patient Gender: F              HR:           79 bpm. [facility] Procedure: 2D Echo, Cardiac Doppler and Color Doppler Indications:    Chest Pain  History:        Patient has prior history of Echocardiogram examinations, most                 recent 04/13/2015. CAD, Signs/Symptoms:Chest Pain; Risk                 Factors:Dyslipidemia.  Sonographer:    Mikki Harbor Referring Phys: [phone] ERIC CHEN IMPRESSIONS  1. Left ventricular ejection fraction, by estimation, is 55 to 60%. The left ventricle has normal function. The left ventricle has no regional wall motion  abnormalities. There is mild left ventricular hypertrophy. Left ventricular diastolic parameters are consistent with Grade I diastolic dysfunction (impaired relaxation).  2. Right ventricular systolic function is normal. The right ventricular size is normal.  3. The mitral valve is normal in structure. Trivial mitral valve regurgitation. No evidence of mitral stenosis.  4. The aortic valve is normal in structure. Aortic valve regurgitation is not visualized. No aortic stenosis is present.  5. The inferior vena cava is normal in size with greater than 50% respiratory variability, suggesting right atrial pressure of 3 mmHg. FINDINGS  Left Ventricle: Left ventricular ejection fraction, by estimation, is 55 to 60%. The left ventricle has normal function. The left ventricle has no regional wall motion abnormalities. The left ventricular internal cavity size was normal in size. There is  mild left ventricular hypertrophy. Left ventricular diastolic parameters are consistent with Grade I diastolic dysfunction (impaired relaxation). Right Ventricle: The right ventricular size is normal. No increase in right ventricular wall thickness. Right ventricular systolic function is normal. Left Atrium: Left atrial size was normal in size. Right Atrium: Right atrial size was normal in size. Pericardium: There is no evidence of pericardial effusion. Mitral Valve: The mitral valve is normal in structure. Trivial mitral valve regurgitation. No evidence of mitral valve stenosis. MV peak gradient, 2.5 mmHg. The mean mitral valve gradient is 1.0 mmHg. Tricuspid Valve: The tricuspid valve is normal in structure. Tricuspid valve regurgitation is not demonstrated. No evidence of tricuspid stenosis. Aortic Valve: The aortic valve is normal in structure. Aortic valve regurgitation is not visualized. No aortic stenosis is present. Aortic valve mean gradient measures 3.0 mmHg. Aortic valve peak gradient measures 4.8 mmHg. Aortic valve area, by  VTI measures 2.47 cm. Pulmonic Valve: The pulmonic valve was normal in structure. Pulmonic valve regurgitation is not visualized. No evidence of pulmonic stenosis. Aorta: The aortic root is normal in size and structure. Venous: The inferior vena cava is normal in size with greater than 50% respiratory variability, suggesting right atrial pressure of 3 mmHg. IAS/Shunts: No atrial level shunt detected by color flow Doppler.  LEFT VENTRICLE PLAX 2D LVIDd:  3.90 cm     Diastology LVIDs:         2.50 cm     LV e' medial:    8.81 cm/s LV PW:         1.20 cm     LV E/e' medial:  5.7 LV IVS:        1.10 cm     LV e' lateral:   7.72 cm/s LVOT diam:     2.00 cm     LV E/e' lateral: 6.5 LV SV:         54 LV SV Index:   31 LVOT Area:     3.14 cm  LV Volumes (MOD) LV vol d, MOD A2C: 42.5 ml LV vol d, MOD A4C: 41.2 ml LV vol s, MOD A2C: 21.6 ml LV vol s, MOD A4C: 18.5 ml LV SV MOD A2C:     20.9 ml LV SV MOD A4C:     41.2 ml LV SV MOD BP:      23.0 ml RIGHT VENTRICLE RV Basal diam:  2.60 cm RV Mid diam:    1.80 cm RV S prime:     16.80 cm/s TAPSE (M-mode): 1.8 cm LEFT ATRIUM             Index        RIGHT ATRIUM           Index LA diam:        2.80 cm 1.62 cm/m   RA Area:     12.30 cm LA Vol (A2C):   36.6 ml 21.12 ml/m  RA Volume:   25.40 ml  14.66 ml/m LA Vol (A4C):   26.2 ml 15.12 ml/m LA Biplane Vol: 31.1 ml 17.95 ml/m  AORTIC VALVE                    PULMONIC VALVE AV Area (Vmax):    2.54 cm     PV Vmax:       0.75 m/s AV Area (Vmean):   2.13 cm     PV Peak grad:  2.2 mmHg AV Area (VTI):     2.47 cm AV Vmax:           109.00 cm/s AV Vmean:          76.000 cm/s AV VTI:            0.219 m AV Peak Grad:      4.8 mmHg AV Mean Grad:      3.0 mmHg LVOT Vmax:         88.10 cm/s LVOT Vmean:        51.600 cm/s LVOT VTI:          0.172 m LVOT/AV VTI ratio: 0.79  AORTA Ao Root diam: 3.40 cm MITRAL VALVE MV Area (PHT): 3.01 cm    SHUNTS MV Area VTI:   3.09 cm    Systemic VTI:  0.17 m MV Peak grad:  2.5 mmHg    Systemic  Diam: 2.00 cm MV Mean grad:  1.0 mmHg MV Vmax:       0.78 m/s MV Vmean:      48.0 cm/s MV Decel Time: 252 msec MV E velocity: 50.10 cm/s MV A velocity: 73.30 cm/s MV E/A ratio:  0.68 Donato Schultz MD Electronically signed by Donato Schultz MD Signature Date/Time: 05/22/2021/3:05:59 PM    Final     Subjective: Eager to go home  Discharge Exam: Vitals:   05/22/21 1012 05/22/21  1403  BP:  120/60  Pulse:  83  Resp:  16  Temp:  97.8 F (36.6 C)  SpO2: 99% 97%   Vitals:   05/22/21 0127 05/22/21 0550 05/22/21 1012 05/22/21 1403  BP: 107/62 110/69  120/60  Pulse: 75 79  83  Resp: Temp: 97.7 F (36.5 C) 97.8 F (36.6 C)  97.8 F (36.6 C)  TempSrc: Oral Oral  Oral  SpO2: 98% 99% 99% 97%  Weight:      Height:        General: Pt is alert, awake, not in acute distress Cardiovascular: RRR, S1/S2  Respiratory: CTA bilaterally, no wheezing, no rhonchi Abdominal: Soft, NT, ND, bowel sounds + Extremities: no edema, no cyanosis   The results of significant diagnostics from this hospitalization (including imaging, microbiology, ancillary and laboratory) are listed below for reference.     Microbiology: Recent Results (from the past 240 hour(s))  Resp Panel by RT-PCR (Flu A&B, Covid) Nasopharyngeal Swab     Status: None   Collection Time: 05/21/21 12:48 PM   Specimen: Nasopharyngeal Swab; Nasopharyngeal(NP) swabs in vial transport medium  Result Value Ref Range Status   SARS Coronavirus 2 by RT PCR NEGATIVE NEGATIVE Final    Comment: (NOTE) SARS-CoV-2 target nucleic acids are NOT DETECTED.  The SARS-CoV-2 RNA is generally detectable in upper respiratory specimens during the acute phase of infection. The lowest concentration of SARS-CoV-2 viral copies this assay can detect is 138 copies/mL. A negative result does not preclude SARS-Cov-2 infection and should not be used as the sole basis for treatment or other patient management decisions. A negative result may occur with   improper specimen collection/handling, submission of specimen other than nasopharyngeal swab, presence of viral mutation(s) within the areas targeted by this assay, and inadequate number of viral copies(<138 copies/mL). A negative result must be combined with clinical observations, patient history, and epidemiological information. The expected result is Negative.  Fact Sheet for Patients:  BloggerCourse.com  Fact Sheet for Healthcare Providers:  SeriousBroker.it  This test is no t yet approved or cleared by the [place] FDA and  has been authorized for detection and/or diagnosis of SARS-CoV-2 by FDA under an Emergency Use Authorization (EUA). This EUA will remain  in effect (meaning this test can be used) for the duration of the COVID-19 declaration under Section 564(b)(1) of the Act, 21 U.S.C.section 360bbb-3(b)(1), unless the authorization is terminated  or revoked sooner.       Influenza A by PCR NEGATIVE NEGATIVE Final   Influenza B by PCR NEGATIVE NEGATIVE Final    Comment: (NOTE) The Xpert Xpress SARS-CoV-2/FLU/RSV plus assay is intended as an aid in the diagnosis of influenza from Nasopharyngeal swab specimens and should not be used as a sole basis for treatment. Nasal washings and aspirates are unacceptable for Xpert Xpress SARS-CoV-2/FLU/RSV testing.  Fact Sheet for Patients: BloggerCourse.com  Fact Sheet for Healthcare Providers: SeriousBroker.it  This test is not yet approved or cleared by the [place] FDA and has been authorized for detection and/or diagnosis of SARS-CoV-2 by FDA under an Emergency Use Authorization (EUA). This EUA will remain in effect (meaning this test can be used) for the duration of the COVID-19 declaration under Section 564(b)(1) of the Act, 21 U.S.C. section 360bbb-3(b)(1), unless the authorization is terminated  or revoked.  Performed at [facility], [address], [place], [place] [address]      Labs: BNP (last 3 results) No results for input(s): BNP  in the last 8760 hours. Basic Metabolic Panel: Recent Labs  Lab 05/21/21 0713  NA 137  K 3.7  CL 104  CO2 26  GLUCOSE 107*  BUN 14  CREATININE 0.74  CALCIUM 9.3   Liver Function Tests: No results for input(s): AST, ALT, ALKPHOS, BILITOT, PROT, ALBUMIN in the last 168 hours. No results for input(s): LIPASE, AMYLASE in the last 168 hours. No results for input(s): AMMONIA in the last 168 hours. CBC: Recent Labs  Lab 05/21/21 0713  WBC 5.0  HGB 13.7  HCT 40.2  MCV 96.6  PLT 354   Cardiac Enzymes: No results for input(s): CKTOTAL, CKMB, CKMBINDEX, TROPONINI in the last 168 hours. BNP: Invalid input(s): POCBNP CBG: No results for input(s): GLUCAP in the last 168 hours. D-Dimer Recent Labs    05/21/21 0713  DDIMER 0.31   Hgb A1c No results for input(s): HGBA1C in the last 72 hours. Lipid Profile Recent Labs    05/22/21 0557  CHOL 150  HDL 65  LDLCALC 74  TRIG 57  CHOLHDL 2.3   Thyroid function studies Recent Labs    05/22/21 0557  TSH 4.103   Anemia work up No results for input(s): VITAMINB12, FOLATE, FERRITIN, TIBC, IRON, RETICCTPCT in the last 72 hours. Urinalysis    Component Value Date/Time   COLORURINE YELLOW 06/22/2009 1546   APPEARANCEUR CLOUDY (A) 06/22/2009 1546   LABSPEC 1.022 06/22/2009 1546   PHURINE 7.0 06/22/2009 1546   GLUCOSEU NEGATIVE 06/22/2009 1546   HGBUR NEGATIVE 06/22/2009 1546   BILIRUBINUR NEGATIVE 06/22/2009 1546   KETONESUR NEGATIVE 06/22/2009 1546   PROTEINUR NEGATIVE 06/22/2009 1546   UROBILINOGEN 1.0 06/22/2009 1546   NITRITE NEGATIVE 06/22/2009 1546   LEUKOCYTESUR TRACE (A) 06/22/2009 1546   Sepsis Labs Invalid input(s): PROCALCITONIN,  WBC,  LACTICIDVEN Microbiology Recent Results (from the past 240 hour(s))  Resp Panel by RT-PCR (Flu A&B, Covid) Nasopharyngeal  Swab     Status: None   Collection Time: 05/21/21 12:48 PM   Specimen: Nasopharyngeal Swab; Nasopharyngeal(NP) swabs in vial transport medium  Result Value Ref Range Status   SARS Coronavirus 2 by RT PCR NEGATIVE NEGATIVE Final    Comment: (NOTE) SARS-CoV-2 target nucleic acids are NOT DETECTED.  The SARS-CoV-2 RNA is generally detectable in upper respiratory specimens during the acute phase of infection. The lowest concentration of SARS-CoV-2 viral copies this assay can detect is 138 copies/mL. A negative result does not preclude SARS-Cov-2 infection and should not be used as the sole basis for treatment or other patient management decisions. A negative result may occur with  improper specimen collection/handling, submission of specimen other than nasopharyngeal swab, presence of viral mutation(s) within the areas targeted by this assay, and inadequate number of viral copies(<138 copies/mL). A negative result must be combined with clinical observations, patient history, and epidemiological information. The expected result is Negative.  Fact Sheet for Patients:  BloggerCourse.com  Fact Sheet for Healthcare Providers:  SeriousBroker.it  This test is no t yet approved or cleared by the [place] FDA and  has been authorized for detection and/or diagnosis of SARS-CoV-2 by FDA under an Emergency Use Authorization (EUA). This EUA will remain  in effect (meaning this test can be used) for the duration of the COVID-19 declaration under Section 564(b)(1) of the Act, 21 U.S.C.section 360bbb-3(b)(1), unless the authorization is terminated  or revoked sooner.       Influenza A by PCR NEGATIVE NEGATIVE Final   Influenza B by PCR NEGATIVE NEGATIVE Final  Comment: (NOTE) The Xpert Xpress SARS-CoV-2/FLU/RSV plus assay is intended as an aid in the diagnosis of influenza from Nasopharyngeal swab specimens and should not be used as a sole  basis for treatment. Nasal washings and aspirates are unacceptable for Xpert Xpress SARS-CoV-2/FLU/RSV testing.  Fact Sheet for Patients: BloggerCourse.com  Fact Sheet for Healthcare Providers: SeriousBroker.it  This test is not yet approved or cleared by the [place] FDA and has been authorized for detection and/or diagnosis of SARS-CoV-2 by FDA under an Emergency Use Authorization (EUA). This EUA will remain in effect (meaning this test can be used) for the duration of the COVID-19 declaration under Section 564(b)(1) of the Act, 21 U.S.C. section 360bbb-3(b)(1), unless the authorization is terminated or revoked.  Performed at [facility], [address] [place], [place] [address]    Time spent: 30 min  SIGNED:   Rickey Barbara, MD  [facility] 05/22/2021, 3:57 PM  If 7PM-7AM, please contact night-coverage

## 2021-05-22 NOTE — Progress Notes (Signed)
*  PRELIMINARY RESULTS* Echocardiogram 2D Echocardiogram has been performed.  [name] [name] 05/22/2021, 1:59 PM

## 2021-05-22 NOTE — Progress Notes (Addendum)
Progress Note  Patient Name: [name] [name] Date of Encounter: 05/22/2021  Primary Cardiologist: Nona Dell, MD  Subjective   Feeling better this AM, no CP, just tired.  Inpatient Medications    Scheduled Meds:  aspirin  325 mg Oral Daily   enoxaparin (LOVENOX) injection  40 mg Subcutaneous Q24H   rosuvastatin  5 mg Oral Daily   Continuous Infusions:  PRN Meds: acetaminophen **OR** acetaminophen, melatonin, nitroGLYCERIN, ondansetron **OR** ondansetron (ZOFRAN) IV   Vital Signs    Vitals:   05/21/21 1741 05/21/21 2129 05/22/21 0127 05/22/21 0550  BP:  124/60 107/62 110/69  Pulse:  77 75 79  Resp:  20 18 19   Temp:  97.8 F (36.6 C) 97.7 F (36.5 C) 97.8 F (36.6 C)  TempSrc:  Oral Oral Oral  SpO2:  94% 98% 99%  Weight: 70.1 kg     Height:        Intake/Output Summary (Last 24 hours) at 05/22/2021 0804 Last data filed at 05/21/2021 2200 Gross per 24 hour  Intake 480 ml  Output --  Net 480 ml   Last 3 Weights 05/21/2021 05/21/2021 09/04/2020  Weight (lbs) 154 lb 8.7 oz 155 lb 144 lb 12.8 oz  Weight (kg) 70.1 kg 70.308 kg 65.681 kg     Telemetry    NSR - Personally Reviewed  ECG    Reviewed yesterday  Physical Exam   GEN: No acute distress.  HEENT: Normocephalic, atraumatic, sclera non-icteric. Neck: No JVD or bruits. Cardiac: RRR no murmurs, rubs, or gallops.  Respiratory: Clear to auscultation bilaterally. Breathing is unlabored. GI: Soft, nontender, non-distended, BS +x 4. MS: no deformity. Extremities: No clubbing or cyanosis. No edema. Distal pedal pulses are 2+ and equal bilaterally. Neuro:  AAOx3. Follows commands. Psych:  Responds to questions appropriately with a normal affect.  Labs    High Sensitivity Troponin:   Recent Labs  Lab 05/21/21 0713 05/21/21 0852 05/21/21 1110 05/21/21 1317 05/21/21 1529  TROPONINIHS 4 9 22* 19* 14      Cardiac EnzymesNo results for input(s): TROPONINI in the last 168 hours. No results for  input(s): TROPIPOC in the last 168 hours.   Chemistry Recent Labs  Lab 05/21/21 0713  NA 137  K 3.7  CL 104  CO2 26  GLUCOSE 107*  BUN 14  CREATININE 0.74  CALCIUM 9.3  GFRNONAA >60  ANIONGAP 7     Hematology Recent Labs  Lab 05/21/21 0713  WBC 5.0  RBC 4.16  HGB 13.7  HCT 40.2  MCV 96.6  MCH 32.9  MCHC 34.1  RDW 12.4  PLT 354    BNPNo results for input(s): BNP, PROBNP in the last 168 hours.   DDimer  Recent Labs  Lab 05/21/21 0713  DDIMER 0.31     Radiology    DG Chest Port 1 View  Result Date: 05/21/2021 CLINICAL DATA:  60 year old female with history of chest pain radiating into the neck and left arm since this morning. Shortness of breath. EXAM: PORTABLE CHEST 1 VIEW COMPARISON:  No priors. FINDINGS: Lung volumes are normal. No consolidative airspace disease. No pleural effusions. No pneumothorax. No pulmonary nodule or mass noted. Pulmonary vasculature and the cardiomediastinal silhouette are within normal limits. Atherosclerosis in the thoracic aorta. IMPRESSION: 1.  No radiographic evidence of acute cardiopulmonary disease. 2. Aortic atherosclerosis. Electronically Signed   By: 47 M.D.   On: 05/21/2021 07:30   CT ANGIO CHEST AORTA W/CM & OR WO/CM  Result Date:  05/21/2021 CLINICAL DATA:  Chest and left arm pain, numbness since this morning EXAM: CT ANGIOGRAPHY CHEST WITH CONTRAST TECHNIQUE: Multidetector CT imaging of the chest was performed using the standard protocol during bolus administration of intravenous contrast. Multiplanar CT image reconstructions and MIPs were obtained to evaluate the vascular anatomy. CONTRAST:  10mL OMNIPAQUE IOHEXOL 350 MG/ML SOLN COMPARISON:  05/21/2021 FINDINGS: Cardiovascular: No evidence of thoracic aortic aneurysm or dissection. Mild atherosclerosis of the aortic arch and descending thoracic aorta. The heart is unremarkable without pericardial effusion. While not optimized for opacification of the pulmonary  vasculature, there is sufficient contrast enhancement to exclude pulmonary emboli. No filling defects. Mediastinum/Nodes: No enlarged mediastinal, hilar, or axillary lymph nodes. Thyroid gland, trachea, and esophagus demonstrate no significant findings. Lungs/Pleura: Upper lobe predominant emphysema, with a large bulla at the left apex. No acute airspace disease, effusion, or pneumothorax. Central airways are patent. Upper Abdomen: No acute abnormality. Musculoskeletal: No acute or destructive bony lesions. Reconstructed imaging demonstrates no additional findings. Review of the MIP images confirms the above findings. IMPRESSION: 1. No evidence of thoracic aortic aneurysm or dissection. 2. No acute pulmonary embolus. 3. Aortic Atherosclerosis (ICD10-I70.0) and Emphysema (ICD10-J43.9). Electronically Signed   By: Sharlet Salina M.D.   On: 05/21/2021 15:24    Cardiac Studies   Pending  Patient Profile     60 y.o. female with hx of nonobstructive CAD (2008 50% LAD otherwise no other significant CAD), pericardial cyst resection 2010, former tobacco use, anxiety, depression, GERD, IBS who presented to [facility] with several hours of severe chest pain and parasthesias.  Assessment & Plan    1. Chest pain with marginally elevated troponin, known history of mild CAD - symptoms are mixed atypical/typical with minimal troponin uptrend, surprisingly still quite low despite severity of symptoms - 4->9->22->19->14 (total duration of pain was 6am-2pm - 1.5 hours of severe pain then 2/10 pain thereafter) - CT angio without PE or dissection, + aortic atherosclerosis but no mention of coronary calcifications - plan echo and nuclear stress test today - if stress unrevealing, consider vasospasm in differential - no need for full dose ASA from cardiac standpoint, will defer to primary team about lowering to 81mg    2. Possible neurologic symptoms with dizziness, face tingling, left arm numbness - discussed with EDP, will  defer further eval to IM - CT angio ruled out aortic dissection   3. History of pericardial cyst resection - noted    4. Hx of statin therapy for mild CAD - LDL 74, consider titration of rosuvastatin  Tentatively arranged f/u 10/25, appt placed on AVS.  For questions or updates, please contact [facility] Please consult [email or web address] for contact info under Cardiology/STEMI.  Signed, 11/25, PA-C 05/22/2021, 8:04 AM    Personally seen and examined. Agree with above.  Currently comfortable without any chest discomfort.  Getting stress test.  Alert and oriented x3 no acute distress, normal respiratory effort.  Chest discomfort - Minimally elevated troponin of 22, no dissection or PE on CT scan.  She did have LAD and RCA coronary calcifications noted personally reviewed and interpreted.  Nuclear stress test Currently pending.  This will be helpful to look for any signs of high risk ischemia.  Coronary artery calcification especially in LAD - Continue with Crestor.  Would recommend increasing to 20 mg daily.  LDL 74 on most recent check.  Aortic atherosclerosis  - Continue with statin ASA 81 would be reasonable.   07/22/2021, MD

## 2021-06-01 ENCOUNTER — Other Ambulatory Visit (HOSPITAL_COMMUNITY): Payer: Self-pay | Admitting: Radiology

## 2021-06-01 DIAGNOSIS — R0602 Shortness of breath: Secondary | ICD-10-CM

## 2021-06-04 NOTE — Progress Notes (Signed)
Cardiology Office Note  Date: 06/05/2021   ID: [name] [name], [name] [name] [birth year], MRN [id number]  PCP:  Wendall Papa  Cardiologist:  Nona Dell, MD Electrophysiologist:  None   Chief Complaint: Hospital follow-up  History of Present Illness: [name] [name] is a 60 y.o. female with a history of CAD, GERD, Recurrent CP.  On last encounter with Dr. Diona Browner via telemedicine visit 06/10/2019.  She had been busy at work and not exercising very regularly.  He talked about a regular exercise plan.  He spoke about considering low-dose statin therapy.  She had been on Zocor in the past and was tolerating it.  History of nonobstructive disease based on previous work-up for pericardial cyst resection 2010.  No reported anginal symptoms.  Continue with observation and taking aspirin 81 mg daily.  He was requesting her most recent lipid panel from PCP.  Recent admission on 05/21/2021 with complaint of chest pain which woke her up from sleep.  Described as over the left side of her breast.  Radiates to the left side of her neck and down her left arm.  She had associated shortness of breath.  She had noticed over the prior 6-week increasing fatigue and shortness of breath with simple ADLs.  Needing to rest when walking upstairs.  History of prior smoking no previous diagnosis of COPD.  Cardiology was consulted.  She had a negative stress test.  Echo was unremarkable with out wall motion abnormalities and normal EF.  CT scan showed emphysematous changes and a large left apical bleb.  Provider mentions she would benefit from seeing pulmonology as outpatient.  Shortness of breath not from cardiac cause.  Most likely due to undiagnosed COPD.  Recommended outpatient PFT and pulmonary referral.  She is here today for hospital follow-up.  She states PFTs were recently ordered by PCP.  She states she has had no recurrence of chest pain which caused her to present for recent admission.  She admits to a long  history of smoking but has since stopped.  She continues with some DOE.  We discussed the results of recent chest CT with left apical bleb and evidence of emphysematous changes.  She is in no acute respiratory or cardiac distress today.  O2 sat on room air 96%..  She states over the last few months she has noticed some increased activity intolerance and shortness of breath prior to recent episode of chest pain.  She mentions history of snoring and possible hyper apneic and apneic episodes.  Also complains of sudden excessive daytime sleepiness.    Past Medical History:  Diagnosis Date   Anxiety and depression    CAD (coronary artery disease)    Nonobstructive 2008    GERD (gastroesophageal reflux disease)    Irritable bowel syndrome    Migraine headache    Pericardial cyst    Resected 2010    Recurrent chest pain     Past Surgical History:  Procedure Laterality Date   BREAST BIOPSY Left    Left VAT with resection of pericardial cyst  November 2010   [name]   PARTIAL HYSTERECTOMY      Current Outpatient Medications  Medication Sig Dispense Refill   albuterol (VENTOLIN HFA) 108 (90 Base) MCG/ACT inhaler Inhale 1 puff into the lungs as needed for wheezing or shortness of breath.     aspirin EC 81 MG tablet Take 1 tablet (81 mg total) by mouth daily. 90 tablet 3   ibandronate (BONIVA)  150 MG tablet Take 150 mg by mouth every 30 (thirty) days. Take in the morning with a full glass of water, on an empty stomach, and do not take anything else by mouth or lie down for the next 30 min.     rosuvastatin (CRESTOR) 20 MG tablet Take 1 tablet (20 mg total) by mouth daily. 30 tablet 0   vitamin B-12 (CYANOCOBALAMIN) 1000 MCG tablet Take 1,000 mcg by mouth daily.     No current facility-administered medications for this visit.   Allergies:  Ciprofloxacin, Penicillins, Sulfamethoxazole-trimethoprim, and Tramadol hcl   Social History: The patient  reports that she quit smoking about 9  years ago. Her smoking use included cigarettes. She has never used smokeless tobacco. She reports that she does not drink alcohol and does not use drugs.   Family History: The patient's family history includes COPD in her mother; Hypertension in her mother; Lung cancer in her mother; Transient ischemic attack in her mother.   ROS:  Please see the history of present illness. Otherwise, complete review of systems is positive for none.  All other systems are reviewed and negative.   Physical Exam: VS:  BP 122/80   Pulse 81   Ht 5\' 3"  (1.6 m)   Wt 158 lb 12.8 oz (72 kg)   SpO2 96%   BMI 28.13 kg/m , BMI Body mass index is 28.13 kg/m.  Wt Readings from Last 3 Encounters:  06/05/21 158 lb 12.8 oz (72 kg)  05/21/21 154 lb 8.7 oz (70.1 kg)  09/04/20 144 lb 12.8 oz (65.7 kg)    General: Patient appears comfortable at rest. Neck: Supple, no elevated JVP or carotid bruits, no thyromegaly. Lungs: Clear to auscultation, nonlabored breathing at rest. Cardiac: Regular rate and rhythm, no S3 or significant systolic murmur, no pericardial rub. Extremities: No pitting edema, distal pulses 2+. Skin: Warm and dry. Musculoskeletal: No kyphosis. Neuropsychiatric: Alert and oriented x3, affect grossly appropriate.  ECG:  An ECG dated 09/04/2020 was personally reviewed today and demonstrated:  Sinus tachycardia nonspecific ST abnormality rate of 109.  Recent Labwork: 05/21/2021: BUN 14; Creatinine, Ser 0.74; Hemoglobin 13.7; Platelets 354; Potassium 3.7; Sodium 137 05/22/2021: TSH 4.103     Component Value Date/Time   CHOL 150 05/22/2021 0557   TRIG 57 05/22/2021 0557   HDL 65 05/22/2021 0557   CHOLHDL 2.3 05/22/2021 0557   VLDL 11 05/22/2021 0557   LDLCALC 74 05/22/2021 0557    Other Studies Reviewed Today:  CT angio aorta 05/21/2020 IMPRESSION: 1. No evidence of thoracic aortic aneurysm or dissection. 2. No acute pulmonary embolus. 3. Aortic Atherosclerosis (ICD10-I70.0) and Emphysema  (ICD10-J4   Nuclear stress test 05/22/2021   The study is normal. The study is low risk.   No ST deviation was noted.   Images are interpretable but effected by bowel loop attenuation in the inferior lateral distribution (rest > stress)   LV perfusion is normal. There is no evidence of ischemia. There is no evidence of infarction.   Left ventricular function is normal. End diastolic cavity size is normal. End systolic cavity size is normal. No evidence of transient ischemic dilation (TID) noted.  Echocardiogram 05/22/2021 1. Left ventricular ejection fraction, by estimation, is 55 to 60%. The  left ventricle has normal function. The left ventricle has no regional  wall motion abnormalities. There is mild left ventricular hypertrophy.  Left ventricular diastolic parameters  are consistent with Grade I diastolic dysfunction (impaired relaxation).   2. Right ventricular systolic  function is normal. The right ventricular  size is normal.   3. The mitral valve is normal in structure. Trivial mitral valve  regurgitation. No evidence of mitral stenosis.   4. The aortic valve is normal in structure. Aortic valve regurgitation is  not visualized. No aortic stenosis is present.   5. The inferior vena cava is normal in size with greater than 50%  respiratory variability, suggesting right atrial pressure of 3 mmHg.     Echocardiogram 04/13/2015: Study Conclusions   - Left ventricle: The cavity size was normal. Wall thickness was   normal. Systolic function was normal. The estimated ejection   fraction was in the range of 55% to 60%. Wall motion was normal;   there were no regional wall motion abnormalities. Doppler   parameters are consistent with abnormal left ventricular   relaxation (grade 1 diastolic dysfunction). - Ventricular septum: Septal motion showed abnormal function and   dyssynergy. - Right atrium: Central venous pressure (est): 3 mm Hg. - Tricuspid valve: There was trivial  regurgitation. - Pulmonary arteries: PA peak pressure: 27 mm Hg (S). - Pericardium, extracardiac: A prominent pericardial fat pad was   present.   Impressions:   - Normal LV wall thickness with LVEF 55-60% and grade 1 diastolic   dysfunction. Normal global longitudinal strain of -18%. Septal   dyssynergy noted. Trivial tricuspid regurgitation with PASP 27   mmHg.  Assessment and Plan:  1. Atherosclerosis of native coronary artery of native heart without angina pectoris   2. Status post pericardial cyst excision   3. Mixed hyperlipidemia   4. Chronic obstructive pulmonary disease, unspecified COPD type (HCC)     1. Atherosclerosis of native coronary artery of native heart without angina pectoris/chest pain Denies any anginal or exertional symptoms.  Continue aspirin 81 mg daily.  Refill sublingual nitroglycerin.  Continue sublingual nitroglycerin as needed  2. Status post pericardial cyst excision Previous excision of pericardial cyst back in 2010 by Dr. Maren Beach.  No issues subsequent.  3. Mixed hyperlipidemia Lipid panel 05/22/2021: TC 150, TG 57, HDL 65, LDL 74.  Continue Crestor 20 mg daily.  4.  COPD /long history of smoking/currently non-smoker/shortness of breath Needs pulmonary referral and PFTs per discharge summary.  Long history of smoking, no current smoking.  Recent CT with a left apical bleb and evidence of emphysematous changes.  PCP has ordered PFTs.  5.  Suspected sleep apnea Patient admits to significant history of snoring with possible apneic/hypopneic episodes and excessive daytime sleepiness.  We are referring to pulmonary for evidence of COPD, history of smoking, CT with left apical plaque and evidence of emphysematous changes.  She needs sleep study evaluation also by pulmonary  Medication Adjustments/Labs and Tests Ordered: Current medicines are reviewed at length with the patient today.  Concerns regarding medicines are outlined above.   Disposition:  Follow-up with Dr. Diona Browner or APP 3 months  Signed, Rennis Harding, NP 06/05/2021 11:13 AM    [facility] [address] [place], [place], [place] [address] Phone: [phone]; Fax: [phone]

## 2021-06-05 ENCOUNTER — Encounter: Payer: Self-pay | Admitting: Family Medicine

## 2021-06-05 ENCOUNTER — Ambulatory Visit (INDEPENDENT_AMBULATORY_CARE_PROVIDER_SITE_OTHER): Payer: 59 | Admitting: Family Medicine

## 2021-06-05 VITALS — BP 122/80 | HR 81 | Ht 63.0 in | Wt 158.8 lb

## 2021-06-05 DIAGNOSIS — I2510 Atherosclerotic heart disease of native coronary artery without angina pectoris: Secondary | ICD-10-CM

## 2021-06-05 DIAGNOSIS — Z8774 Personal history of (corrected) congenital malformations of heart and circulatory system: Secondary | ICD-10-CM | POA: Diagnosis not present

## 2021-06-05 DIAGNOSIS — J449 Chronic obstructive pulmonary disease, unspecified: Secondary | ICD-10-CM

## 2021-06-05 DIAGNOSIS — E782 Mixed hyperlipidemia: Secondary | ICD-10-CM | POA: Diagnosis not present

## 2021-06-05 DIAGNOSIS — R29818 Other symptoms and signs involving the nervous system: Secondary | ICD-10-CM

## 2021-06-05 DIAGNOSIS — J439 Emphysema, unspecified: Secondary | ICD-10-CM

## 2021-06-05 DIAGNOSIS — R0602 Shortness of breath: Secondary | ICD-10-CM

## 2021-06-05 MED ORDER — NITROGLYCERIN 0.4 MG SL SUBL: 0.4000 mg | SUBLINGUAL_TABLET | SUBLINGUAL | 3 refills | Status: DC | PRN

## 2021-06-05 NOTE — Patient Instructions (Signed)
Medication Instructions:  Nitroglycerin refilled today Continue all other current medications.  Labwork: none  Testing/Procedures: none  Follow-Up: 3 months   Any Other Special Instructions Will Be Listed Below (If Applicable). You have been referred to:  Pulmonology  If you need a refill on your cardiac medications before your next appointment, please call your pharmacy.

## 2021-06-25 ENCOUNTER — Other Ambulatory Visit: Payer: Self-pay | Admitting: Family Medicine

## 2021-07-13 ENCOUNTER — Other Ambulatory Visit (HOSPITAL_COMMUNITY)
Admission: RE | Admit: 2021-07-13 | Discharge: 2021-07-13 | Disposition: A | Discharge status: Home / Self Care | Payer: 59 | Source: Ambulatory Visit | Attending: Internal Medicine | Admitting: Internal Medicine

## 2021-07-13 DIAGNOSIS — Z01818 Encounter for other preprocedural examination: Secondary | ICD-10-CM

## 2021-07-17 ENCOUNTER — Ambulatory Visit (HOSPITAL_COMMUNITY)
Admission: RE | Admit: 2021-07-17 | Discharge: 2021-07-17 | Disposition: A | Discharge status: Home / Self Care | Payer: 59 | Source: Ambulatory Visit | Attending: Internal Medicine | Admitting: Internal Medicine

## 2021-07-17 ENCOUNTER — Other Ambulatory Visit: Payer: Self-pay

## 2021-07-17 DIAGNOSIS — R0602 Shortness of breath: Secondary | ICD-10-CM | POA: Diagnosis present

## 2021-07-17 LAB — PULMONARY FUNCTION TEST
DL/VA % pred: 52 %
DL/VA: 2.23 ml/min/mmHg/L
DLCO cor % pred: 45 %
DLCO cor: 8.95 ml/min/mmHg
DLCO unc % pred: 43 %
DLCO unc: 8.44 ml/min/mmHg
FEF 25-75 Post: 0.77 L/sec
FEF 25-75 Pre: 0.88 L/sec
FEF2575-%Change-Post: -12 %
FEF2575-%Pred-Post: 32 %
FEF2575-%Pred-Pre: 37 %
FEV1-%Change-Post: -3 %
FEV1-%Pred-Post: 68 %
FEV1-%Pred-Pre: 70 %
FEV1-Post: 1.69 L
FEV1-Pre: 1.76 L
FEV1FVC-%Change-Post: -2 %
FEV1FVC-%Pred-Pre: 81 %
FEV6-%Change-Post: 0 %
FEV6-%Pred-Post: 86 %
FEV6-%Pred-Pre: 86 %
FEV6-Post: 2.69 L
FEV6-Pre: 2.68 L
FEV6FVC-%Change-Post: 1 %
FEV6FVC-%Pred-Post: 101 %
FEV6FVC-%Pred-Pre: 99 %
FVC-%Change-Post: -1 %
FVC-%Pred-Post: 85 %
FVC-%Pred-Pre: 86 %
FVC-Post: 2.74 L
FVC-Pre: 2.77 L
Post FEV1/FVC ratio: 62 %
Post FEV6/FVC ratio: 98 %
Pre FEV1/FVC ratio: 64 %
Pre FEV6/FVC Ratio: 96 %
RV % pred: 165 %
RV: 3.18 L
TLC % pred: 120 %
TLC: 5.89 L

## 2021-07-17 MED ORDER — ALBUTEROL SULFATE (2.5 MG/3ML) 0.083% IN NEBU
2.5000 mg | INHALATION_SOLUTION | Freq: Once | RESPIRATORY_TRACT | Status: AC
  Administered 2021-07-17: 2.5 mg via RESPIRATORY_TRACT

## 2021-07-25 ENCOUNTER — Telehealth: Payer: Self-pay | Admitting: Cardiology

## 2021-07-25 NOTE — Telephone Encounter (Signed)
Clarification for which result she wanted-she is asking for pulmonary test result Says Vyas ordered PFT's-Advised to contact Vyas office for result

## 2021-07-25 NOTE — Telephone Encounter (Signed)
Patient is calling for the results to her stress test.

## 2021-08-14 ENCOUNTER — Institutional Professional Consult (permissible substitution): Payer: 59 | Admitting: Pulmonary Disease

## 2021-08-16 ENCOUNTER — Ambulatory Visit (INDEPENDENT_AMBULATORY_CARE_PROVIDER_SITE_OTHER): Payer: 59 | Admitting: Cardiology

## 2021-08-16 ENCOUNTER — Encounter: Payer: Self-pay | Admitting: Cardiology

## 2021-08-16 VITALS — BP 120/80 | HR 89 | Ht 63.0 in | Wt 159.2 lb

## 2021-08-16 DIAGNOSIS — Z8774 Personal history of (corrected) congenital malformations of heart and circulatory system: Secondary | ICD-10-CM

## 2021-08-16 DIAGNOSIS — I2510 Atherosclerotic heart disease of native coronary artery without angina pectoris: Secondary | ICD-10-CM

## 2021-08-16 NOTE — Progress Notes (Signed)
Cardiology Office Note  Date: 08/16/2021   ID: [name] [name], DOB 1961/08/06, MRN [id number]  PCP:  Wendall Papa  Cardiologist:  Nona Dell, MD Electrophysiologist:  None   Chief Complaint  Patient presents with   Cardiac follow-up    History of Present Illness: [name] [name] is a 61 y.o. female last seen in October 2022 by Mr. Vincenza Hews NP.  She is here for a follow-up visit.  She does not describe any angina symptoms at this time, intermittently short of breath.  She has been using an albuterol MDI occasionally and is scheduled to see Dr. Craige Cotta tomorrow for pulmonary assessment, arranged by her PCP.  PFT results per Dr. Sherene Sires from December 2022 indicated mild obstructive airways disease consistent with emphysema.  Chest CTA in October 2022 did show upper lobe predominant emphysema with large bulla at the left apex, no pulmonary embolus.  She has a history of nonobstructive CAD, most recently underwent follow-up echocardiogram and Myoview in October 22 which were overall reassuring.  She remains on aspirin and Crestor..  Past Medical History:  Diagnosis Date   Anxiety and depression    CAD (coronary artery disease)    Nonobstructive 2008    GERD (gastroesophageal reflux disease)    Irritable bowel syndrome    Migraine headache    Pericardial cyst    Resected 2010    Recurrent chest pain     Past Surgical History:  Procedure Laterality Date   BREAST BIOPSY Left    Left VAT with resection of pericardial cyst  November 2010   [name]   PARTIAL HYSTERECTOMY      Current Outpatient Medications  Medication Sig Dispense Refill   albuterol (VENTOLIN HFA) 108 (90 Base) MCG/ACT inhaler Inhale 1 puff into the lungs as needed for wheezing or shortness of breath.     aspirin EC 81 MG tablet Take 1 tablet (81 mg total) by mouth daily. 90 tablet 3   ibandronate (BONIVA) 150 MG tablet Take 150 mg by mouth every 30 (thirty) days. Take in the morning with a full glass of  water, on an empty stomach, and do not take anything else by mouth or lie down for the next 30 min.     nitroGLYCERIN (NITROSTAT) 0.4 MG SL tablet place one tablet UNDER THE TONGUE EVERY FIVE minutes AS NEEDED FOR CHEST pain 25 tablet 3   rosuvastatin (CRESTOR) 20 MG tablet Take 1 tablet (20 mg total) by mouth daily. 30 tablet 0   vitamin B-12 (CYANOCOBALAMIN) 1000 MCG tablet Take 1,000 mcg by mouth daily.     No current facility-administered medications for this visit.   Allergies:  Ciprofloxacin, Penicillins, Sulfamethoxazole-trimethoprim, and Tramadol hcl   ROS: No palpitations or syncope.  Physical Exam: VS:  BP 120/80    Pulse 89    Ht 5\' 3"  (1.6 m)    Wt 159 lb 3.2 oz (72.2 kg)    SpO2 96%    BMI 28.20 kg/m , BMI Body mass index is 28.2 kg/m.  Wt Readings from Last 3 Encounters:  08/16/21 159 lb 3.2 oz (72.2 kg)  06/05/21 158 lb 12.8 oz (72 kg)  05/21/21 154 lb 8.7 oz (70.1 kg)    General: Patient appears comfortable at rest. HEENT: Conjunctiva and lids normal, wearing a mask. Neck: Supple, no elevated JVP or carotid bruits, no thyromegaly. Lungs: Clear to auscultation, nonlabored breathing at rest. Cardiac: Regular rate and rhythm, no S3 or significant systolic murmur, no  pericardial rub. Extremities: No pitting edema.  ECG:  An ECG dated 05/21/2021 was personally reviewed today and demonstrated:  Sinus rhythm with nonspecific ST changes.  Recent Labwork: 05/21/2021: BUN 14; Creatinine, Ser 0.74; Hemoglobin 13.7; Platelets 354; Potassium 3.7; Sodium 137 05/22/2021: TSH 4.103     Component Value Date/Time   CHOL 150 05/22/2021 0557   TRIG 57 05/22/2021 0557   HDL 65 05/22/2021 0557   CHOLHDL 2.3 05/22/2021 0557   VLDL 11 05/22/2021 0557   LDLCALC 74 05/22/2021 0557    Other Studies Reviewed Today:  Chest CT 05/21/2021: FINDINGS: Cardiovascular: No evidence of thoracic aortic aneurysm or dissection. Mild atherosclerosis of the aortic arch and descending thoracic  aorta.   The heart is unremarkable without pericardial effusion. While not optimized for opacification of the pulmonary vasculature, there is sufficient contrast enhancement to exclude pulmonary emboli. No filling defects.   Mediastinum/Nodes: No enlarged mediastinal, hilar, or axillary lymph nodes. Thyroid gland, trachea, and esophagus demonstrate no significant findings.   Lungs/Pleura: Upper lobe predominant emphysema, with a large bulla at the left apex. No acute airspace disease, effusion, or pneumothorax. Central airways are patent.   Upper Abdomen: No acute abnormality.   Musculoskeletal: No acute or destructive bony lesions. Reconstructed imaging demonstrates no additional findings.   Review of the MIP images confirms the above findings.   IMPRESSION: 1. No evidence of thoracic aortic aneurysm or dissection. 2. No acute pulmonary embolus. 3. Aortic Atherosclerosis (ICD10-I70.0) and Emphysema (ICD10-J43.9).  Echocardiogram 05/22/2021:  1. Left ventricular ejection fraction, by estimation, is 55 to 60%. The  left ventricle has normal function. The left ventricle has no regional  wall motion abnormalities. There is mild left ventricular hypertrophy.  Left ventricular diastolic parameters  are consistent with Grade I diastolic dysfunction (impaired relaxation).   2. Right ventricular systolic function is normal. The right ventricular  size is normal.   3. The mitral valve is normal in structure. Trivial mitral valve  regurgitation. No evidence of mitral stenosis.   4. The aortic valve is normal in structure. Aortic valve regurgitation is  not visualized. No aortic stenosis is present.   5. The inferior vena cava is normal in size with greater than 50%  respiratory variability, suggesting right atrial pressure of 3 mmHg.   Lexiscan Myoview 05/22/2021:   The study is normal. The study is low risk.   No ST deviation was noted.   Images are interpretable but effected by  bowel loop attenuation in the inferior lateral distribution (rest > stress)   LV perfusion is normal. There is no evidence of ischemia. There is no evidence of infarction.   Left ventricular function is normal. End diastolic cavity size is normal. End systolic cavity size is normal. No evidence of transient ischemic dilation (TID) noted.  Assessment and Plan:  1.  History of nonobstructive CAD, no significant angina symptoms reported on aspirin and Crestor.  Follow-up Myoview in October 2022 was low risk with no evidence of ischemia.  Echocardiogram at that time revealed LVEF 55 to 60%.  2.  History of pericardial cyst status postsurgical resection in 2010.  3.  Emphysematous changes, upper lobe predominant with large bulla at the left apex by CT imaging in October 2022.  PFTs show mild obstructive disease.  She has a prior tobacco use history, quit in 2013.  Currently using an albuterol MDI.  Consultation with pulmonary is pending for tomorrow, arranged by PCP.  Medication Adjustments/Labs and Tests Ordered: Current medicines are reviewed at  length with the patient today.  Concerns regarding medicines are outlined above.   Tests Ordered: No orders of the defined types were placed in this encounter.   Medication Changes: No orders of the defined types were placed in this encounter.   Disposition:  Follow up  6 months.  Signed, Satira Sark, MD, [facility] 08/16/2021 4:41 PM    [facility], [place], [place] [address] Phone: [phone]; Fax: [phone]

## 2021-08-16 NOTE — Patient Instructions (Addendum)

## 2021-08-17 ENCOUNTER — Ambulatory Visit (INDEPENDENT_AMBULATORY_CARE_PROVIDER_SITE_OTHER): Payer: 59 | Admitting: Pulmonary Disease

## 2021-08-17 ENCOUNTER — Other Ambulatory Visit: Payer: Self-pay

## 2021-08-17 ENCOUNTER — Encounter: Payer: Self-pay | Admitting: Pulmonary Disease

## 2021-08-17 VITALS — BP 130/84 | HR 87 | Temp 98.4°F | Ht 63.5 in | Wt 158.0 lb

## 2021-08-17 DIAGNOSIS — J432 Centrilobular emphysema: Secondary | ICD-10-CM | POA: Diagnosis not present

## 2021-08-17 DIAGNOSIS — R0683 Snoring: Secondary | ICD-10-CM | POA: Diagnosis not present

## 2021-08-17 DIAGNOSIS — Z23 Encounter for immunization: Secondary | ICD-10-CM

## 2021-08-17 MED ORDER — TRELEGY ELLIPTA 100-62.5-25 MCG/ACT IN AEPB
1.0000 | INHALATION_SPRAY | Freq: Every day | RESPIRATORY_TRACT | 5 refills | Status: DC
Start: 2021-08-17 — End: 2021-09-03

## 2021-08-17 NOTE — Addendum Note (Signed)
Addended by: Carleene Mains D on: 08/17/2021 11:17 AM   Modules accepted: Orders

## 2021-08-17 NOTE — Patient Instructions (Signed)
Pneumonia 20 vaccine today  Lab tests today  Trelegy one puff daily, and rinse your mouth after each use  Albuterol two puffs every 6 hours as needed for cough, wheeze, chest congestion, or shortness of breath  Will arrange for home sleep study  Will call to arrange for follow up after sleep study reviewed

## 2021-08-17 NOTE — Progress Notes (Signed)
[facility]  Chief Complaint  Patient presents with   Consult    Suspected sleep apnea- snoring.  SOB for Aug/Sept 2022. [facility] ago [facility]     Past Surgical History:  She  has a past surgical history that includes Partial hysterectomy; Left VAT with resection of pericardial cyst (November 2010); and Breast biopsy (Left).  Past Medical History:  Anxiety and depression, CAD, GERD, IBS, Migraine HA, Pericardial cyst s/p resection  Constitutional:  BP 130/84 (BP Location: Left Arm, Patient Position: Sitting)    Pulse 87    Temp 98.4 F (36.9 C)    Ht 5' 3.5" (1.613 m)    Wt 158 lb 0.6 oz (71.7 kg)    SpO2 97%    BMI 27.56 kg/m   Brief Summary:  [name] [name] is a 61 y.o. 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      Subjective:   She has noticed progressive dyspnea for the past several months.  She has been getting winded after walking from her work station to her car.  She developed chest pain in October and thought she was having a heart attack.  In the ER she had CT chest that showed emphysema and large Lt upper lung bulla.  She was started on ventolin.  This helps some, but doesn't last.  She doesn't usually get a cough, wheeze, or chest congestion unless she has a respiratory infection or allergies.  She had asthma as a child.  She has trouble with allergies in the Spring and Fall, but never had allergy testing done.  She smoked 2 ppd and quit in 2012.  Her mother was diagnosed with emphysema in her 5950's and died from lung cancer at age 60.    [place]  She works in a Adult nursefinance office.  She had pneumonia years ago, but didn't need hospitalization.  She has a Development worker, international aidpet dog.  No history of TB.  She did get her flu shot earlier this year.  Her boyfriend says she snores at night and makes funny noises with her breathing.  She gets cramps in her legs at night.  She can fall  asleep when sitting quiet.  She goes to sleep at 9 pm.  She falls asleep in few minutes.  She wakes up occasionally times to use the bathroom.  She gets out of bed at 530 am.  She feels tired in the morning.  She denies morning headache.  She does not use anything to help her fall sleep or stay awake.  She denies sleep walking, sleep talking, bruxism, or nightmares.  There is no history of restless legs.  She denies sleep hallucinations, sleep paralysis, or cataplexy.  The Epworth score is 9 out of 24.   Physical Exam:   Appearance - well kempt   ENMT - no sinus tenderness, no oral exudate, no LAN, Mallampati 4 airway, no stridor  Respiratory - equal breath sounds bilaterally, no wheezing or rales  CV - s1s2 regular rate and rhythm, no murmurs  Ext - no clubbing, no edema  Skin - no rashes  Psych - normal mood and affect   Pulmonary testing:  PFT 07/17/21 >> FEV1 1.76 (70%), FEV1% 64, TLC 5.89 (120%), RV 3.18 (165%), DLCO 43%  Chest Imaging:  CT angio chest 05/21/21 >> centrilobular emphysema with upper lobe predominance, large bulla in Lt apex  Sleep Tests:  Cardiac Tests:  Echo 05/22/21 >> EF 55 to 60%, mild LVH, grade 1 DD  Social History:  She  reports that she quit smoking about 10 years ago. Her smoking use included cigarettes. She has never used smokeless tobacco. She reports that she does not drink alcohol and does not use drugs.  Family History:  Her family history includes COPD in her mother; Hypertension in her mother; Lung cancer in her mother; Transient ischemic attack in her mother.    Discussion:  She has prior history of smoking.  Her CT chest and PFT findings are consistent with COPD and bullous emphysema.  She has family history of emphysema at a relatively young age, and lung cancer.  She has progressive symptoms of dyspnea on exertion.  She has snoring, sleep disruption, apnea, and daytime sleepiness.  She has history of non-obstructive coronary  artery disease and depression.  I am concerned she could have obstructive sleep apnea.  Assessment/Plan:   COPD with bullous emphysema. - reviewed her PFT and CT chest results - will arrange for CBC with diff, CMET, and alpha 1 antitrypsin levels - add trelegy 100 one puff daily - prn albuterol - PCV 20 vaccine today >> this should complete her pneumonia vaccination schedule based on latest CDC guidelines  Allergic rhinitis. - symptoms mostly in Spring and Fall - will need to monitor whether she develops more asthma like symptoms with seasonal changes  Snoring with excessive daytime sleepiness. - will need to arrange for a home sleep study  Obesity. - discussed how weight can impact sleep and risk for sleep disordered breathing - discussed options to assist with weight loss: combination of diet modification, cardiovascular and strength training exercises  Cardiovascular risk. - had an extensive discussion regarding the adverse health consequences related to untreated sleep disordered breathing - specifically discussed the risks for hypertension, coronary artery disease, cardiac dysrhythmias, cerebrovascular disease, and diabetes - lifestyle modification discussed  Safe driving practices. - discussed how sleep disruption can increase risk of accidents, particularly when driving - safe driving practices were discussed  Therapies for obstructive sleep apnea. - if the sleep study shows significant sleep apnea, then various therapies for treatment were reviewed: CPAP, oral appliance, and surgical interventions  Time Spent Involved in Patient Care on Day of Examination:  51 minutes  Follow up:   Patient Instructions  Pneumonia 20 vaccine today  Lab tests today  Trelegy one puff daily, and rinse your mouth after each use  Albuterol two puffs every 6 hours as needed for cough, wheeze, chest congestion, or shortness of breath  Will arrange for home sleep study  Will call to  arrange for follow up after sleep study reviewed   Medication List:   Allergies as of 08/17/2021       Reactions   Ciprofloxacin Diarrhea   c-diff per patient    Penicillins    REACTION: rash   Sulfamethoxazole-trimethoprim    REACTION: SOB   Tramadol Hcl    REACTION: facial swelling        Medication List        Accurate as of August 17, 2021  9:29 AM. If you have any questions, ask your nurse or doctor.          albuterol 108 (90 Base) MCG/ACT inhaler Commonly known as: VENTOLIN HFA Inhale 1 puff into the lungs as needed for wheezing or shortness of breath.   aspirin EC 81 MG tablet Take 1 tablet (81 mg total) by mouth daily.  ibandronate 150 MG tablet Commonly known as: BONIVA Take 150 mg by mouth every 30 (thirty) days. Take in the morning with a full glass of water, on an empty stomach, and do not take anything else by mouth or lie down for the next 30 min.   nitroGLYCERIN 0.4 MG SL tablet Commonly known as: NITROSTAT place one tablet UNDER THE TONGUE EVERY FIVE minutes AS NEEDED FOR CHEST pain   rosuvastatin 20 MG tablet Commonly known as: Crestor Take 1 tablet (20 mg total) by mouth daily.   Trelegy Ellipta 100-62.5-25 MCG/ACT Aepb Generic drug: Fluticasone-Umeclidin-Vilant Inhale 1 puff into the lungs daily. Started by: Coralyn Helling, MD   vitamin B-12 1000 MCG tablet Commonly known as: CYANOCOBALAMIN Take 1,000 mcg by mouth daily.        Signature:  Coralyn Helling, MD [facility] Pulmonary/Critical Care Pager - [phone] 08/17/2021, 9:29 AM

## 2021-08-18 LAB — CBC WITH DIFFERENTIAL/PLATELET
Basophils Absolute: 0 10*3/uL (ref 0.0–0.2)
Basos: 1 %
EOS (ABSOLUTE): 0.1 10*3/uL (ref 0.0–0.4)
Eos: 2 %
Hematocrit: 39.4 % (ref 34.0–46.6)
Hemoglobin: 13.3 g/dL (ref 11.1–15.9)
Immature Grans (Abs): 0 10*3/uL (ref 0.0–0.1)
Immature Granulocytes: 0 %
Lymphocytes Absolute: 1.5 10*3/uL (ref 0.7–3.1)
Lymphs: 34 %
MCH: 32.4 pg (ref 26.6–33.0)
MCHC: 33.8 g/dL (ref 31.5–35.7)
MCV: 96 fL (ref 79–97)
Monocytes Absolute: 0.5 10*3/uL (ref 0.1–0.9)
Monocytes: 11 %
Neutrophils Absolute: 2.4 10*3/uL (ref 1.4–7.0)
Neutrophils: 52 %
Platelets: 302 10*3/uL (ref 150–450)
RBC: 4.1 x10E6/uL (ref 3.77–5.28)
RDW: 12.8 % (ref 11.7–15.4)
WBC: 4.6 10*3/uL (ref 3.4–10.8)

## 2021-08-18 LAB — COMPREHENSIVE METABOLIC PANEL
ALT: 14 IU/L (ref 0–32)
AST: 16 IU/L (ref 0–40)
Albumin/Globulin Ratio: 2.1 (ref 1.2–2.2)
Albumin: 4.6 g/dL (ref 3.8–4.9)
Alkaline Phosphatase: 88 IU/L (ref 44–121)
BUN/Creatinine Ratio: 14 (ref 9–23)
BUN: 10 mg/dL (ref 6–24)
Bilirubin Total: 0.3 mg/dL (ref 0.0–1.2)
CO2: 23 mmol/L (ref 20–29)
Calcium: 9.8 mg/dL (ref 8.7–10.2)
Chloride: 105 mmol/L (ref 96–106)
Creatinine, Ser: 0.71 mg/dL (ref 0.57–1.00)
Globulin, Total: 2.2 g/dL (ref 1.5–4.5)
Glucose: 91 mg/dL (ref 70–99)
Potassium: 4.7 mmol/L (ref 3.5–5.2)
Sodium: 142 mmol/L (ref 134–144)
Total Protein: 6.8 g/dL (ref 6.0–8.5)
eGFR: 98 mL/min/{1.73_m2} (ref >59)

## 2021-08-24 LAB — ALPHA-1-ANTITRYPSIN PHENOTYP: A-1 Antitrypsin: 146 mg/dL (ref 101–187)

## 2021-09-03 ENCOUNTER — Telehealth: Payer: Self-pay | Admitting: Pulmonary Disease

## 2021-09-03 DIAGNOSIS — J432 Centrilobular emphysema: Secondary | ICD-10-CM

## 2021-09-03 MED ORDER — BREZTRI AEROSPHERE 160-9-4.8 MCG/ACT IN AERO: 2.0000 | INHALATION_SPRAY | Freq: Two times a day (BID) | RESPIRATORY_TRACT | 11 refills | Status: DC

## 2021-09-03 NOTE — Telephone Encounter (Signed)
Called patient. She is driving in an area with no service and states she will call back.

## 2021-09-03 NOTE — Telephone Encounter (Signed)
Called and spoke to patient. She voiced understanding about using breztri instead of trelegy. Offered to give her a spacer in office and show her how to use it but patient lives in [place] and states its too far for her to drive and she would rather have one sent to her.   Will send spacer and Breztri Rx

## 2021-09-03 NOTE — Telephone Encounter (Signed)
Can send script for breztri two puffs bid.  Please arrange for spacer to use with breztri.  She needs to rinse her mouth after each use of breztri.

## 2021-09-03 NOTE — Telephone Encounter (Signed)
Patient states lost voice when taking Trelegy. Did not take Saturday or Sunday. States voice is better. She also believes the inhaler caused some hoarseness and for her throat to be a little sore.  Would like to change inhalers.   Dr. Craige Cotta please advise

## 2021-09-11 ENCOUNTER — Ambulatory Visit: Payer: 59 | Admitting: Cardiology

## 2021-11-07 ENCOUNTER — Ambulatory Visit: Payer: 59

## 2021-11-07 ENCOUNTER — Other Ambulatory Visit: Payer: Self-pay

## 2021-11-07 DIAGNOSIS — R0683 Snoring: Secondary | ICD-10-CM

## 2021-11-07 DIAGNOSIS — G4733 Obstructive sleep apnea (adult) (pediatric): Secondary | ICD-10-CM

## 2021-11-09 ENCOUNTER — Telehealth: Payer: Self-pay | Admitting: Pulmonary Disease

## 2021-11-09 DIAGNOSIS — G4733 Obstructive sleep apnea (adult) (pediatric): Secondary | ICD-10-CM | POA: Diagnosis not present

## 2021-11-09 NOTE — Telephone Encounter (Signed)
Called and went over results with patient and she voiced understanding.  ?Appt made for 01/2022 in [facility]. Patient prefers not to go to [facility] office and does not want to do mychart video visit.  ?

## 2021-11-09 NOTE — Telephone Encounter (Signed)
HST 11/07/21 >> AHI 13, SpO2 low 85% ? ?Please inform her that her sleep study shows mild obstructive sleep apnea.  Please arrange for ROV with me or NP to discuss treatment options. ? ? ? ?

## 2022-01-14 ENCOUNTER — Encounter: Payer: Self-pay | Admitting: Pulmonary Disease

## 2022-01-14 ENCOUNTER — Ambulatory Visit (INDEPENDENT_AMBULATORY_CARE_PROVIDER_SITE_OTHER): Payer: 59 | Admitting: Pulmonary Disease

## 2022-01-14 VITALS — BP 132/78 | HR 86 | Temp 98.7°F | Ht 63.0 in | Wt 160.2 lb

## 2022-01-14 DIAGNOSIS — G4733 Obstructive sleep apnea (adult) (pediatric): Secondary | ICD-10-CM

## 2022-01-14 DIAGNOSIS — J432 Centrilobular emphysema: Secondary | ICD-10-CM

## 2022-01-14 NOTE — Progress Notes (Signed)
[facility]  Chief Complaint  Patient presents with   Follow-up    Appt to discuss OSA treatment options     Past Surgical History:  She  has a past surgical history that includes Partial hysterectomy; Left VAT with resection of pericardial cyst (November 2010); and Breast biopsy (Left).  Past Medical History:  Anxiety and depression, CAD, GERD, IBS, Migraine HA, Pericardial cyst s/p resection  Constitutional:  BP 132/78 (BP Location: Left Arm)   Pulse 86   Temp 98.7 F (37.1 C) (Temporal)   Ht 5\' 3"  (1.6 m)   Wt 160 lb 3.2 oz (72.7 kg)   SpO2 97% Comment: ra  BMI 28.38 kg/m   Brief Summary:  [name] [name] is a 61 y.o. 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      Subjective:   Has been using breztri.  Had to work her way up on dose.  Now using two puffs in morning and one puff at night.  This helps. She uses albuterol when she goes for a walk.  Had trouble with allergies last month, but better now.    Her sleep study showed mild sleep apnea.  She has been working on weight loss.  She has noticed TMJ discomfort when she yawns.    Physical Exam:   Appearance - well kempt   ENMT - no sinus tenderness, no oral exudate, no LAN, Mallampati 3 airway, no stridor  Respiratory - equal breath sounds bilaterally, no wheezing or rales  CV - s1s2 regular rate and rhythm, no murmurs  Ext - no clubbing, no edema  Skin - no rashes  Psych - normal mood and affect    Pulmonary testing:  PFT 07/17/21 >> FEV1 1.76 (70%), FEV1% 64, TLC 5.89 (120%), RV 3.18 (165%), DLCO 43% A1AT 08/17/21 >> 146, MM  Chest Imaging:  CT angio chest 05/21/21 >> centrilobular emphysema with upper lobe predominance, large bulla in Lt apex  Sleep Tests:  HST 11/07/21 >> AHI 13, SpO2 low 85%  Cardiac Tests:  Echo 05/22/21 >> EF 55 to 60%, mild LVH, grade 1 DD  Social History:  She  reports that she quit smoking  about 10 years ago. Her smoking use included cigarettes. She has never used smokeless tobacco. She reports that she does not drink alcohol and does not use drugs.  Family History:  Her family history includes COPD in her mother; Hypertension in her mother; Lung cancer in her mother; Transient ischemic attack in her mother.     Assessment/Plan:   COPD with bullous emphysema. - trelegy caused hoarseness - continue breztri two puffs in the morning and one puff in the evening - prn albuterol  Allergic rhinitis. - symptoms mostly in Spring and Fall - will need to monitor whether she develops more asthma like symptoms with seasonal changes  Obstructive sleep apnea. - reviewed her sleep study - discussed how sleep apnea can impact her health - reviewed treatment options - TMJ issues might limit use of oral appliance - she would like to try weight loss efforts first  Time Spent Involved in Patient Care on Day of Examination:  40 minutes  Follow up:   Patient Instructions  Follow up in 6 months  Medication List:   Allergies as of 01/14/2022       Reactions   Ciprofloxacin Diarrhea   c-diff per patient    Penicillins    REACTION: rash   Sulfamethoxazole-trimethoprim  REACTION: SOB   Tramadol Hcl    REACTION: facial swelling        Medication List        Accurate as of January 14, 2022  9:04 AM. If you have any questions, ask your nurse or doctor.          albuterol 108 (90 Base) MCG/ACT inhaler Commonly known as: VENTOLIN HFA Inhale 1 puff into the lungs as needed for wheezing or shortness of breath.   aspirin EC 81 MG tablet Take 1 tablet (81 mg total) by mouth daily.   Breztri Aerosphere 160-9-4.8 MCG/ACT Aero Generic drug: Budeson-Glycopyrrol-Formoterol Inhale 2 puffs into the lungs in the morning and at bedtime.   ibandronate 150 MG tablet Commonly known as: BONIVA Take 150 mg by mouth every 30 (thirty) days. Take in the morning with a full glass of  water, on an empty stomach, and do not take anything else by mouth or lie down for the next 30 min.   nitroGLYCERIN 0.4 MG SL tablet Commonly known as: NITROSTAT place one tablet UNDER THE TONGUE EVERY FIVE minutes AS NEEDED FOR CHEST pain   rosuvastatin 20 MG tablet Commonly known as: Crestor Take 1 tablet (20 mg total) by mouth daily.   vitamin B-12 1000 MCG tablet Commonly known as: CYANOCOBALAMIN Take 1,000 mcg by mouth daily.        Signature:  Coralyn Helling, MD [facility] Pulmonary/Critical Care Pager - [phone] 01/14/2022, 9:04 AM

## 2022-01-14 NOTE — Patient Instructions (Signed)
Follow up in 6 months 

## 2022-02-01 ENCOUNTER — Other Ambulatory Visit (HOSPITAL_COMMUNITY): Payer: Self-pay | Admitting: Nurse Practitioner

## 2022-02-01 DIAGNOSIS — Z1231 Encounter for screening mammogram for malignant neoplasm of breast: Secondary | ICD-10-CM

## 2022-03-20 ENCOUNTER — Ambulatory Visit (HOSPITAL_COMMUNITY)
Admission: RE | Admit: 2022-03-20 | Discharge: 2022-03-20 | Disposition: A | Discharge status: Home / Self Care | Payer: 59 | Source: Ambulatory Visit | Attending: Nurse Practitioner | Admitting: Nurse Practitioner

## 2022-03-20 DIAGNOSIS — Z1231 Encounter for screening mammogram for malignant neoplasm of breast: Secondary | ICD-10-CM | POA: Diagnosis present

## 2022-07-20 NOTE — Progress Notes (Signed)
Cardiology Office Note:    Date:  07/22/2022   ID:  [name] [name], DOB November 25, 1960, MRN [id number]  PCP:  [facility] Providers Cardiologist:  Nona Dell, MD     Referring MD: Wendall Papa, NP   CC: Here for follow-up  History of Present Illness:    [name] [name] is a 61 y.o. female with a hx of the following  CAD HLD GERD Recurrent CP COPD   Patient is a 61 year old female with past medical history as mentioned above. Hx of noobstructive CAD based on past workup, underwent pericardial cyst resection in 2010.  Was admitted in October 2022 with chief complaint of chest pain that awakened her from sleep, had associated shortness of breath with this and had noted 6-week increasing fatigue and shortness of breath with ADLs.  Cardiology was consulted and had negative stress test.  Echo was unremarkable with normal EF.  CT revealed COPD, large left apical bulla. Was recommended to see pulmonology as outpatient.  Was also recommended outpatient PFTs.  Last seen by Dr. Diona Browner on August 16, 2021.  Did admit to intermittent shortness of breath at this visit.  Was going to be following up with pulmonology outpatient.  PFT showed findings consistent with emphysema.   Today she presents for follow-up. She states she is doing well.  Overall denies any chest pain, but says she does maybe experiences CP when overexerting herself, denies any anginal symptoms, very atypical lasting only few seconds in duration.  Denies any shortness of breath, palpitations, syncope, presyncope, dizziness, orthopnea, PND, lower extremity swelling, acute bleeding, claudication.  Tolerating medications well.  Denies any tobacco use.  States she is mainly sedentary as she works in an Conservation officer, historic buildings.  She is due for labs and denies any other questions or concerns today.   Past Medical History:  Diagnosis Date   Anxiety and depression    CAD (coronary artery disease)     Nonobstructive 2008    COPD (chronic obstructive pulmonary disease) with emphysema (HCC)    GERD (gastroesophageal reflux disease)    Irritable bowel syndrome    Migraine headache    OSA (obstructive sleep apnea)    Pericardial cyst    Resected 2010    Recurrent chest pain     Past Surgical History:  Procedure Laterality Date   BREAST BIOPSY Left    Left VAT with resection of pericardial cyst  November 2010   [name]   PARTIAL HYSTERECTOMY      Current Medications: Current Meds  Medication Sig   albuterol (VENTOLIN HFA) 108 (90 Base) MCG/ACT inhaler Inhale 1 puff into the lungs as needed for wheezing or shortness of breath.   aspirin EC 81 MG tablet Take 1 tablet (81 mg total) by mouth daily.   Budeson-Glycopyrrol-Formoterol (BREZTRI AEROSPHERE) 160-9-4.8 MCG/ACT AERO Inhale 2 puffs into the lungs in the morning and at bedtime.   ibandronate (BONIVA) 150 MG tablet Take 150 mg by mouth every 30 (thirty) days. Take in the morning with a full glass of water, on an empty stomach, and do not take anything else by mouth or lie down for the next 30 min.   nitroGLYCERIN (NITROSTAT) 0.4 MG SL tablet place one tablet UNDER THE TONGUE EVERY FIVE minutes AS NEEDED FOR CHEST pain   rosuvastatin (CRESTOR) 20 MG tablet Take 1 tablet (20 mg total) by mouth daily.   vitamin B-12 (CYANOCOBALAMIN) 1000 MCG tablet Take 1,000 mcg  by mouth daily.     Allergies:   Ciprofloxacin, Penicillins, Sulfamethoxazole-trimethoprim, and Tramadol hcl   Social History   Socioeconomic History   Marital status: Divorced    Spouse name: Not on file   Number of children: Not on file   Years of education: Not on file   Highest education level: Not on file  Occupational History   Not on file  Tobacco Use   Smoking status: Former    Types: Cigarettes    Quit date: 08/13/2011    Years since quitting: 10.9   Smokeless tobacco: Never   Tobacco comments:    Smoked for 20-some years  Vaping Use   Vaping  Use: Never used  Substance and Sexual Activity   Alcohol use: No    Comment: rare glass of wine   Drug use: No   Sexual activity: Not on file  Other Topics Concern   Not on file  Social History Narrative   Not on file   Social Determinants of Health   Financial Resource Strain: Not on file  Food Insecurity: Not on file  Transportation Needs: Not on file  Physical Activity: Not on file  Stress: Not on file  Social Connections: Not on file     Family History: The patient's family history includes COPD in her mother; Hypertension in her mother; Lung cancer in her mother; Transient ischemic attack in her mother.  ROS:   Review of Systems  Constitutional: Negative.   HENT: Negative.    Eyes: Negative.   Respiratory: Negative.    Cardiovascular: Negative.   Gastrointestinal: Negative.   Genitourinary: Negative.   Musculoskeletal: Negative.   Skin: Negative.   Neurological: Negative.   Endo/Heme/Allergies: Negative.   Psychiatric/Behavioral: Negative.      Please see the history of present illness.    All other systems reviewed and are negative.  EKGs/Labs/Other Studies Reviewed:    The following studies were reviewed today:   EKG:  EKG is ordered today.  The ekg ordered today demonstrates sinus rhythm, 83 bpm, nonspecific ST wave abnormality as seen on previous EKG.  No acute ischemic changes.  2D echocardiogram on May 22, 2021: 1. Left ventricular ejection fraction, by estimation, is 55 to 60%. The  left ventricle has normal function. The left ventricle has no regional  wall motion abnormalities. There is mild left ventricular hypertrophy.  Left ventricular diastolic parameters  are consistent with Grade I diastolic dysfunction (impaired relaxation).   2. Right ventricular systolic function is normal. The right ventricular  size is normal.   3. The mitral valve is normal in structure. Trivial mitral valve  regurgitation. No evidence of mitral stenosis.   4.  The aortic valve is normal in structure. Aortic valve regurgitation is  not visualized. No aortic stenosis is present.   5. The inferior vena cava is normal in size with greater than 50%  respiratory variability, suggesting right atrial pressure of 3 mmHg.    Lexiscan on May 22, 2021:   The study is normal. The study is low risk.   No ST deviation was noted.   Images are interpretable but effected by bowel loop attenuation in the inferior lateral distribution (rest > stress)   LV perfusion is normal. There is no evidence of ischemia. There is no evidence of infarction.   Left ventricular function is normal. End diastolic cavity size is normal. End systolic cavity size is normal. No evidence of transient ischemic dilation (TID) noted.  Cardiac MRI on 05/28/2007:  1.   Pericardial cyst adjacent to the lateral wall of the left lateral ventricle.  2.   Normal left ventricular motion with an ejection fraction of 62%.  The present exam was performed in conjunction with Dr. Charlton Haws of [facility].   Recent Labs: 08/17/2021: ALT 14; BUN 10; Creatinine, Ser 0.71; Hemoglobin 13.3; Platelets 302; Potassium 4.7; Sodium 142  Recent Lipid Panel    Component Value Date/Time   CHOL 150 05/22/2021 0557   TRIG 57 05/22/2021 0557   HDL 65 05/22/2021 0557   CHOLHDL 2.3 05/22/2021 0557   VLDL 11 05/22/2021 0557   LDLCALC 74 05/22/2021 0557    Physical Exam:    VS:  BP 122/78   Pulse 94   Ht 5' 3.5" (1.613 m)   Wt 162 lb (73.5 kg)   SpO2 94%   BMI 28.25 kg/m     Wt Readings from Last 3 Encounters:  07/22/22 162 lb (73.5 kg)  01/14/22 160 lb 3.2 oz (72.7 kg)  08/17/21 158 lb 0.6 oz (71.7 kg)     GEN: Well nourished, well developed 61 year old female in no acute distress HEENT: Hoarse voice, wears glasses, otherwise normal NECK: No JVD; No carotid bruits CARDIAC: S1/S2, RRR, no murmurs, rubs, gallops; 2+ peripheral pulses throughout, strong and equal bilaterally RESPIRATORY:   Clear and diminished to auscultation without rales, wheezing or rhonchi  MUSCULOSKELETAL:  No edema; No deformity  SKIN: Warm and dry NEUROLOGIC:  Alert and oriented x 3 PSYCHIATRIC:  Normal affect   ASSESSMENT:    1. Coronary artery disease involving native heart without angina pectoris, unspecified vessel or lesion type   2. Medication management   3. Mixed hyperlipidemia   4. S/P pericardial cyst excision   5. Chronic obstructive pulmonary disease, unspecified COPD type (HCC)    PLAN:    In order of problems listed above:  Nonobstructive CAD Atypical presentation of chest pain; however she denies any anginal symptoms, only noticed rarely with over exertion. Continue ASA and Crestor as well as NG PRN. Heart healthy diet and regular cardiovascular exercise encouraged. Will obtain Lipid panel. Heart healthy diet and regular cardiovascular exercise encouraged. ED precautions discussed.  Recommended if symptoms worsen, would consider repeating ischemic testing if indicated.  Hyperlipidemia Labs from 1 year ago revealed total cholesterol 150, triglycerides 57, HDL 65, and LDL 74.  Will repeat lipid panel at this time.  Continue Crestor 20 mg daily. Heart healthy diet and regular cardiovascular exercise encouraged.   History of pericardial cyst, status post resection in 2010 Doing well with no current complaints.  She is due for labs and will obtain CBC and BMET at this time. Heart healthy diet and regular cardiovascular exercise encouraged.   COPD Denies any recent worsening symptoms or acute exacerbations.  Continue current medication regimen.  Continue to follow-up with pulmonology.   5.  Disposition: Follow-up with Dr. Diona Browner in 6 months or sooner if anything changes.   Cbc, bmet, lipid panel Medication Adjustments/Labs and Tests Ordered: Current medicines are reviewed at length with the patient today.  Concerns regarding medicines are outlined above.  Orders Placed This  Encounter  Procedures   CBC   Basic metabolic panel   Lipid panel   EKG 12-Lead   No orders of the defined types were placed in this encounter.   Patient Instructions  Medication Instructions:  Continue all current medications.  Labwork: BMET, CBC, FLP - orders given today Reminder:  Nothing to eat or drink after 12  midnight prior to labs. Office will contact with results via phone, letter or mychart.    Testing/Procedures: none  Follow-Up: 6 months   Any Other Special Instructions Will Be Listed Below (If Applicable).   If you need a refill on your cardiac medications before your next appointment, please call your pharmacy.    SignedSharlene Dory, NP  07/22/2022 10:13 AM    [facility]

## 2022-07-22 ENCOUNTER — Encounter: Payer: Self-pay | Admitting: Nurse Practitioner

## 2022-07-22 ENCOUNTER — Ambulatory Visit: Payer: BC Managed Care – PPO | Attending: Nurse Practitioner | Admitting: Nurse Practitioner

## 2022-07-22 VITALS — BP 122/78 | HR 94 | Ht 63.5 in | Wt 162.0 lb

## 2022-07-22 DIAGNOSIS — J449 Chronic obstructive pulmonary disease, unspecified: Secondary | ICD-10-CM

## 2022-07-22 DIAGNOSIS — Z8774 Personal history of (corrected) congenital malformations of heart and circulatory system: Secondary | ICD-10-CM | POA: Diagnosis not present

## 2022-07-22 DIAGNOSIS — E782 Mixed hyperlipidemia: Secondary | ICD-10-CM | POA: Diagnosis not present

## 2022-07-22 DIAGNOSIS — Z79899 Other long term (current) drug therapy: Secondary | ICD-10-CM | POA: Diagnosis not present

## 2022-07-22 DIAGNOSIS — I2510 Atherosclerotic heart disease of native coronary artery without angina pectoris: Secondary | ICD-10-CM | POA: Diagnosis not present

## 2022-07-22 NOTE — Patient Instructions (Signed)
Medication Instructions:  Continue all current medications.  Labwork: BMET, CBC, FLP - orders given today Reminder:  Nothing to eat or drink after 12 midnight prior to labs. Office will contact with results via phone, letter or mychart.    Testing/Procedures: none  Follow-Up: 6 months   Any Other Special Instructions Will Be Listed Below (If Applicable).   If you need a refill on your cardiac medications before your next appointment, please call your pharmacy.

## 2022-07-26 ENCOUNTER — Other Ambulatory Visit (HOSPITAL_COMMUNITY)
Admission: RE | Admit: 2022-07-26 | Discharge: 2022-07-26 | Disposition: A | Discharge status: Home / Self Care | Payer: BC Managed Care – PPO | Source: Ambulatory Visit | Attending: Nurse Practitioner | Admitting: Nurse Practitioner

## 2022-07-26 DIAGNOSIS — Z79899 Other long term (current) drug therapy: Secondary | ICD-10-CM | POA: Diagnosis present

## 2022-07-26 DIAGNOSIS — I2510 Atherosclerotic heart disease of native coronary artery without angina pectoris: Secondary | ICD-10-CM | POA: Diagnosis present

## 2022-07-26 DIAGNOSIS — E782 Mixed hyperlipidemia: Secondary | ICD-10-CM

## 2022-07-26 LAB — CBC
HCT: 40.7 % (ref 36.0–46.0)
Hemoglobin: 13.7 g/dL (ref 12.0–15.0)
MCH: 33 pg (ref 26.0–34.0)
MCHC: 33.7 g/dL (ref 30.0–36.0)
MCV: 98.1 fL (ref 80.0–100.0)
Platelets: 314 10*3/uL (ref 150–400)
RBC: 4.15 MIL/uL (ref 3.87–5.11)
RDW: 12.7 % (ref 11.5–15.5)
WBC: 4.4 10*3/uL (ref 4.0–10.5)
nRBC: 0 % (ref 0.0–0.2)

## 2022-07-26 LAB — BASIC METABOLIC PANEL
Anion gap: 5 (ref 5–15)
BUN: 14 mg/dL (ref 6–20)
CO2: 28 mmol/L (ref 22–32)
Calcium: 9.5 mg/dL (ref 8.9–10.3)
Chloride: 109 mmol/L (ref 98–111)
Creatinine, Ser: 0.62 mg/dL (ref 0.44–1.00)
GFR, Estimated: >60 mL/min (ref >60)
Glucose, Bld: 99 mg/dL (ref 70–99)
Potassium: 4.5 mmol/L (ref 3.5–5.1)
Sodium: 142 mmol/L (ref 135–145)

## 2022-07-26 LAB — LIPID PANEL
Cholesterol: 136 mg/dL (ref 0–200)
HDL: 75 mg/dL (ref >40)
LDL Cholesterol: 52 mg/dL (ref 0–99)
Total CHOL/HDL Ratio: 1.8 RATIO
Triglycerides: 46 mg/dL (ref <150)
VLDL: 9 mg/dL (ref 0–40)

## 2022-11-29 ENCOUNTER — Other Ambulatory Visit: Payer: Self-pay | Admitting: Pulmonary Disease

## 2022-11-29 ENCOUNTER — Telehealth: Payer: Self-pay | Admitting: Pulmonary Disease

## 2022-11-29 NOTE — Telephone Encounter (Signed)
Called and spoke w/ pt let her know I sent refills to [facility] for her [place]. She verbalized understanding. NFN at time of call

## 2022-11-29 NOTE — Telephone Encounter (Signed)
PT would like her RX called in. [facility] has sent a Fax as well today:  Budeson-Glycopyrrol-Formoterol

## 2022-12-02 IMAGING — DX DG CHEST 1V PORT
1 series · 1 of 1 positions shown · non-contrast
Comparison: No priors.

CLINICAL DATA: 60-year-old female with history of chest pain
radiating into the neck and left arm since this morning. Shortness
of breath.

EXAM:
PORTABLE CHEST 1 VIEW

[chest ap]
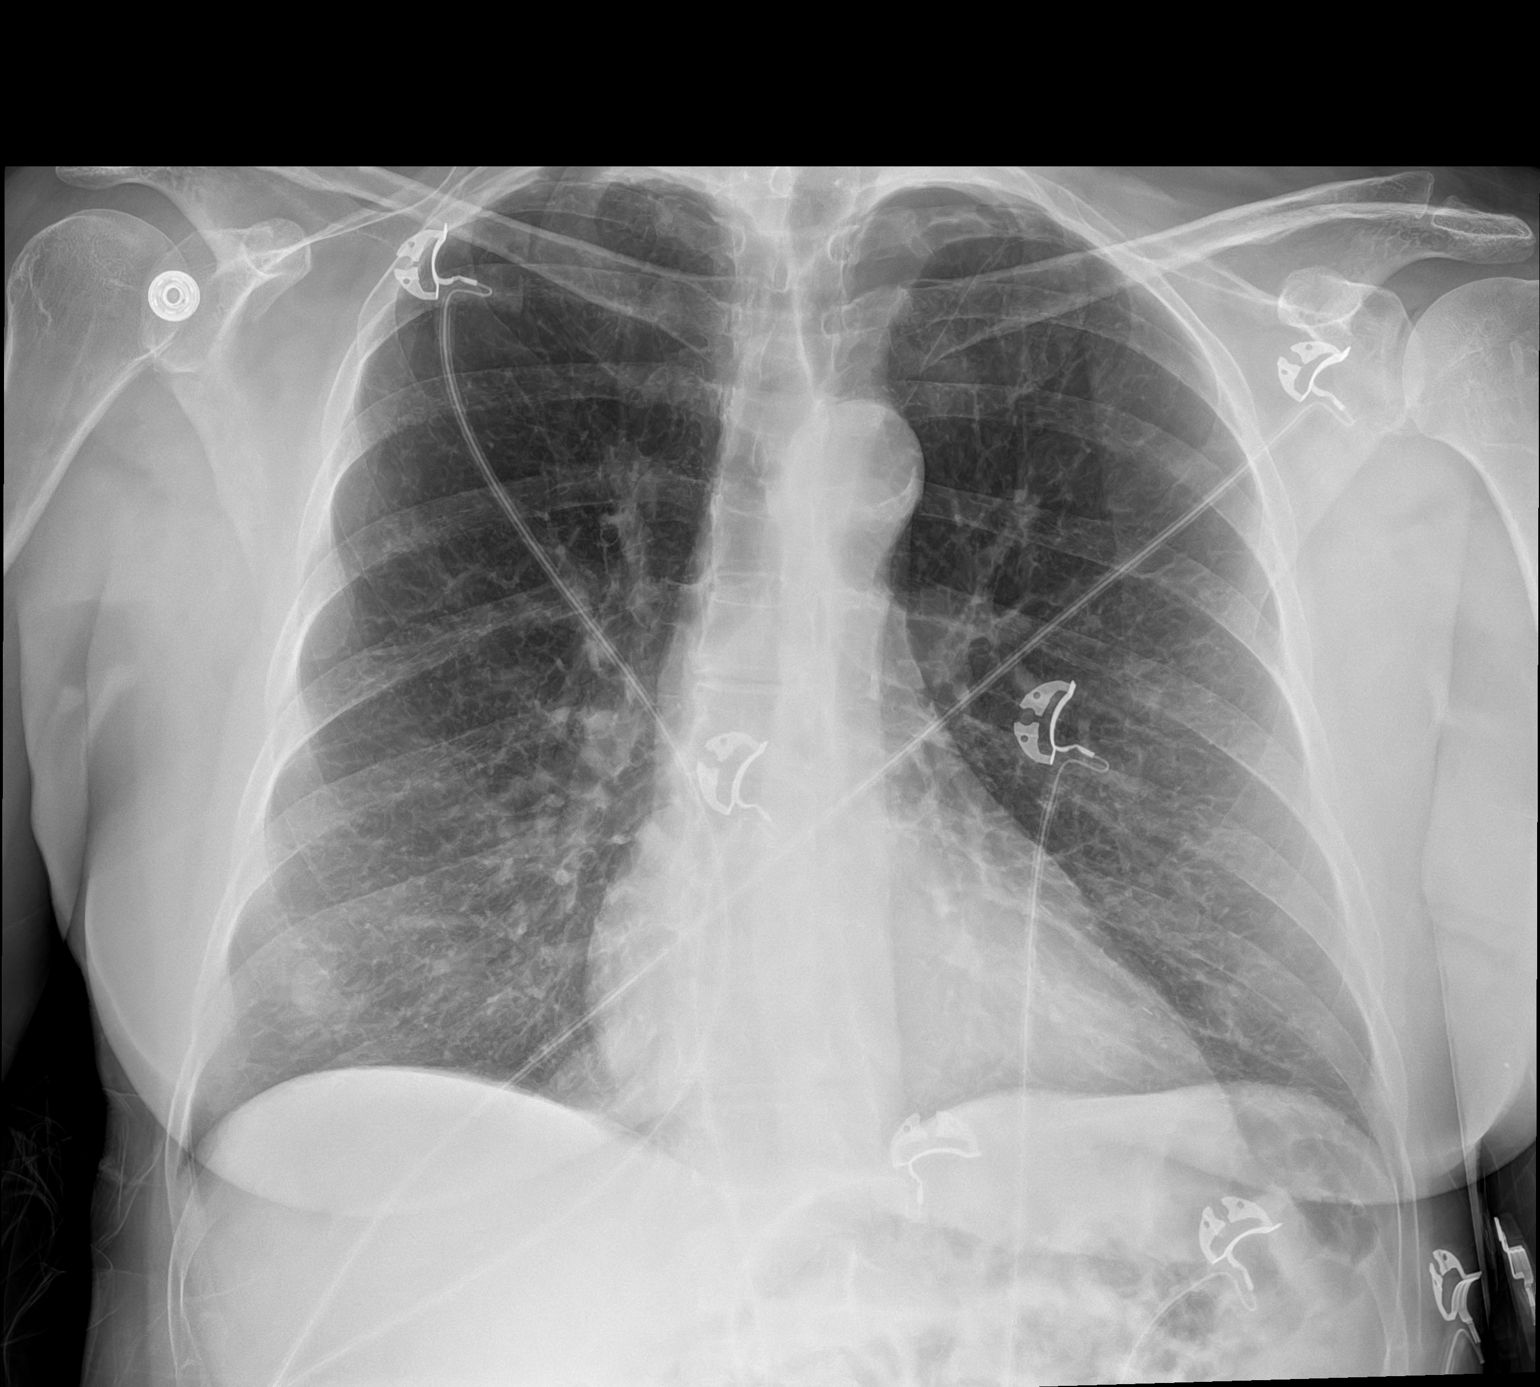

[1 of 1 positions shown; findings below may reference images not displayed]

FINDINGS: Lung volumes are normal. No consolidative airspace disease. No
pleural effusions. No pneumothorax. No pulmonary nodule or mass
noted. Pulmonary vasculature and the cardiomediastinal silhouette
are within normal limits. Atherosclerosis in the thoracic aorta.
IMPRESSION: 1.  No radiographic evidence of acute cardiopulmonary disease.
2. Aortic atherosclerosis.

## 2022-12-02 IMAGING — CT CT ANGIO CHEST
2 of 7 series · 16 of 46 positions shown · IV contrast (omnipaque)
Comparison: 05/21/2021

CLINICAL DATA: Chest and left arm pain, numbness since this morning

EXAM:
CT ANGIOGRAPHY CHEST WITH CONTRAST
TECHNIQUE: Multidetector CT imaging of the chest was performed using the
standard protocol during bolus administration of intravenous
contrast. Multiplanar CT image reconstructions and MIPs were
obtained to evaluate the vascular anatomy.
CONTRAST:  75mL OMNIPAQUE IOHEXOL 350 MG/ML SOLN

[Series 8: lungs · axial · 0.63mm/px · z∈[+1411,+1643]mm · 13 of 136 slices shown]
[im 10/136  lung]
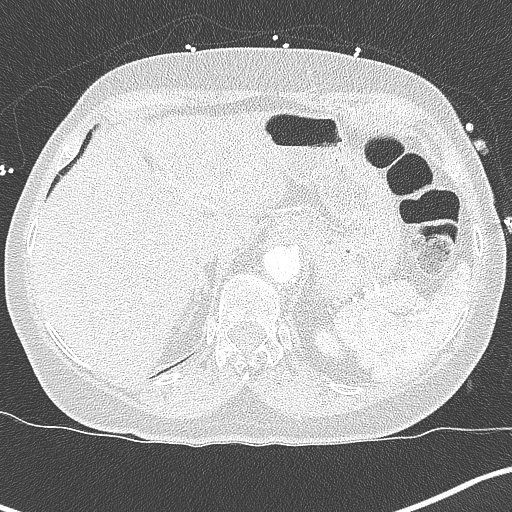
[im 20/136  soft-tissue]
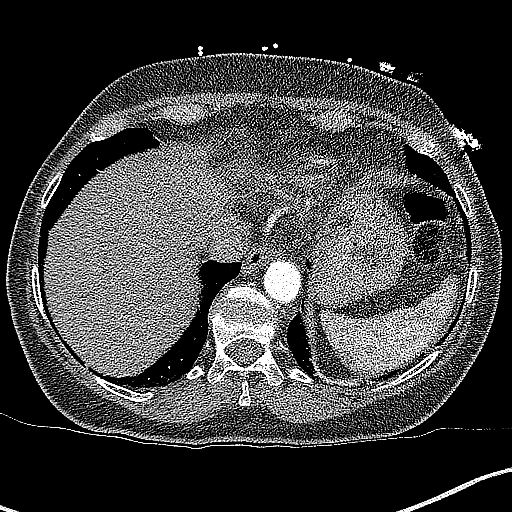
[im 29/136  lung]
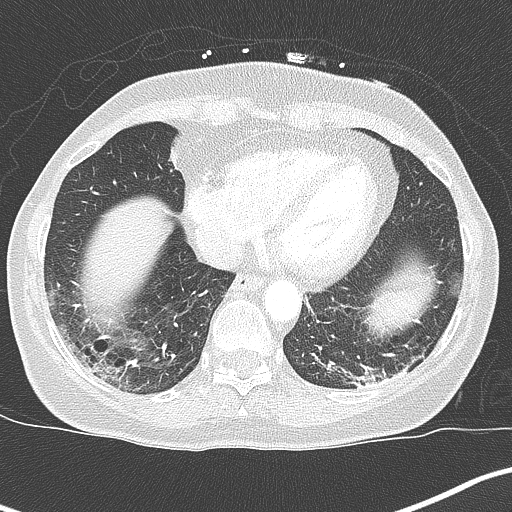
[im 39/136  soft-tissue]
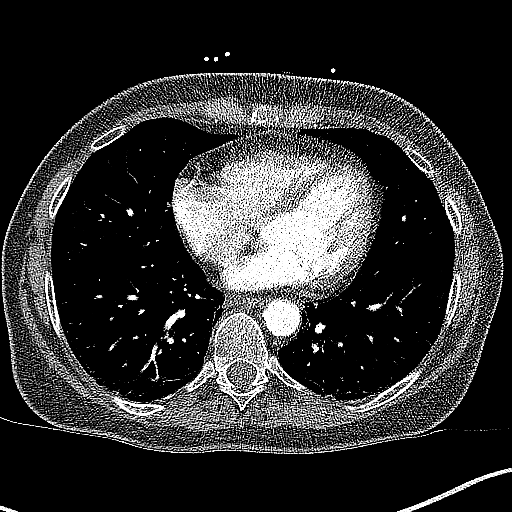
[im 49/136  lung]
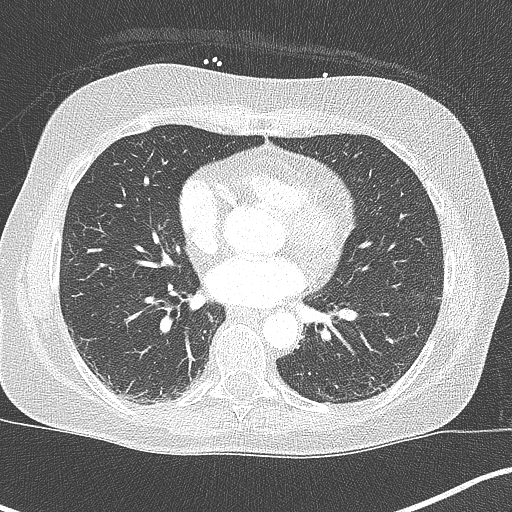
[im 58/136  soft-tissue]
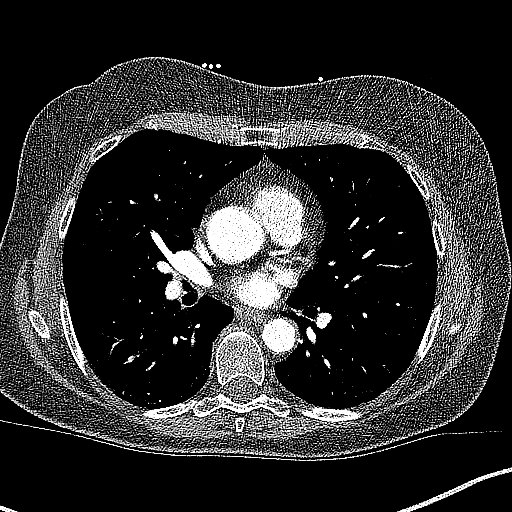
[im 68/136  lung]
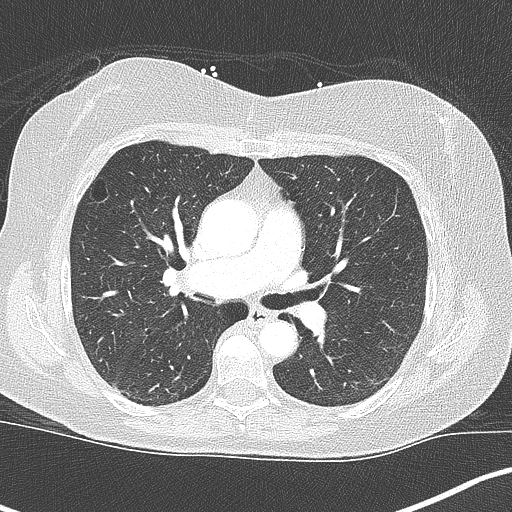
[im 78/136  soft-tissue]
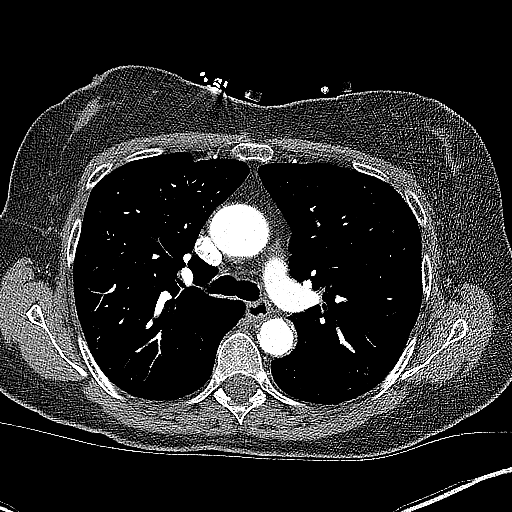
[im 87/136  lung]
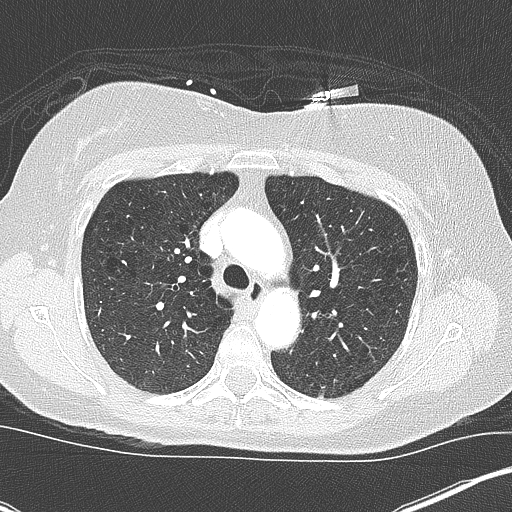
[im 97/136  soft-tissue]
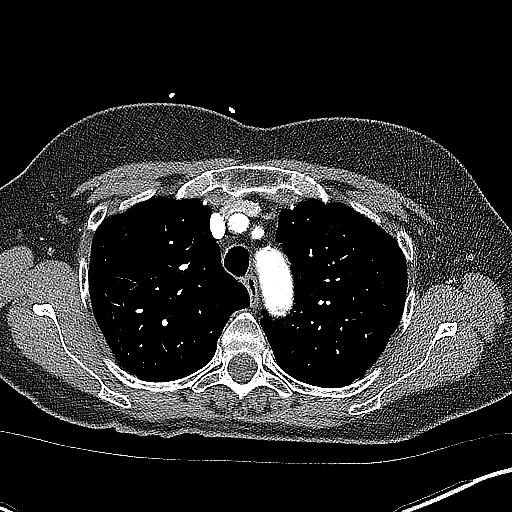
[im 107/136  lung]
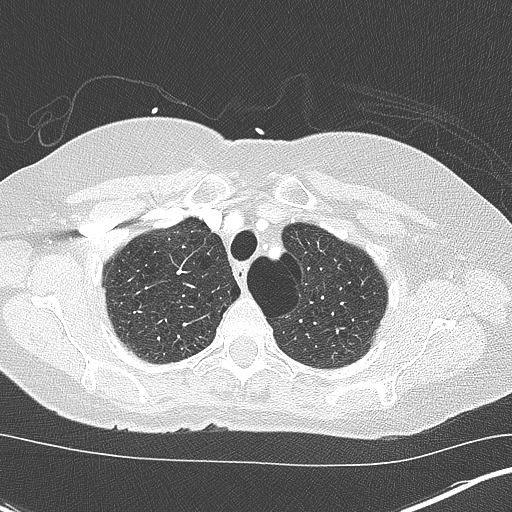
[im 116/136  soft-tissue]
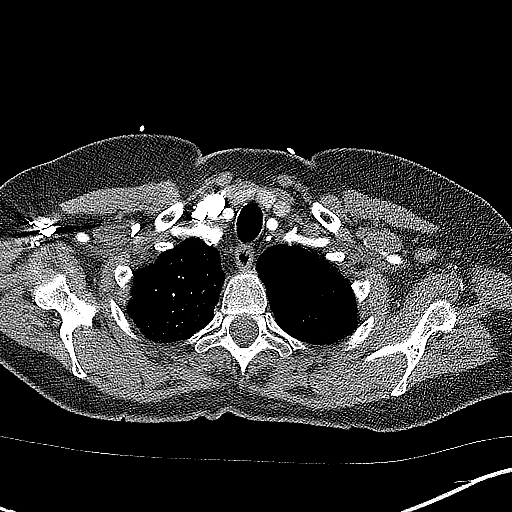
[im 126/136  lung]
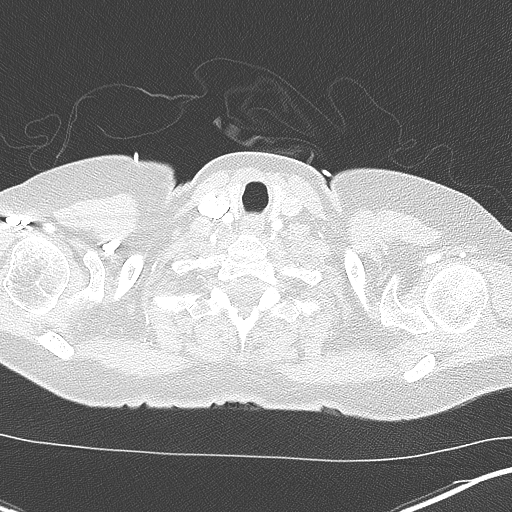

[Series 9: cor soft · coronal · 0.56mm/px · 3 of 123 slices shown]
[im 31/123  soft-tissue]
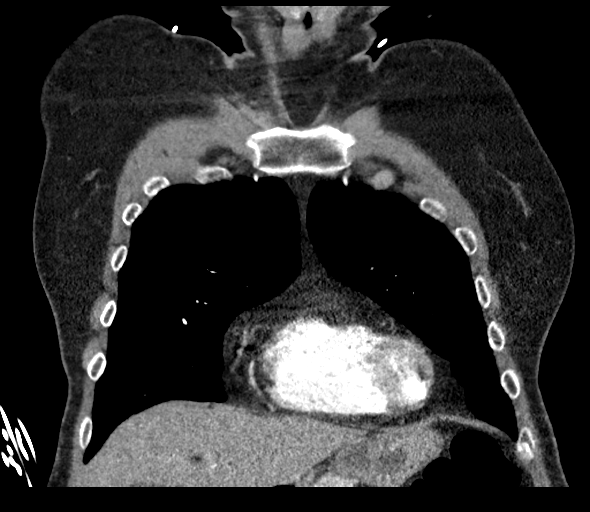
[im 62/123  soft-tissue]
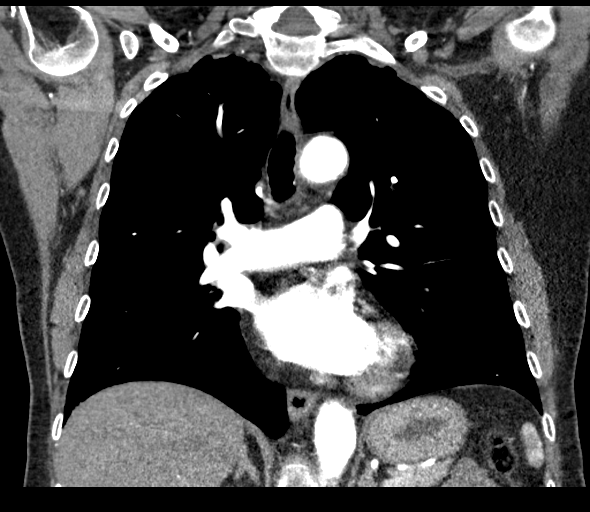
[im 92/123  soft-tissue]
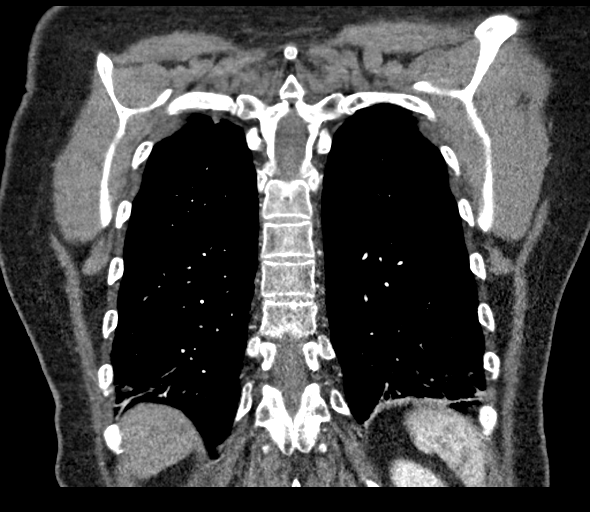

[16 of 46 positions shown; findings below may reference images not displayed]

FINDINGS: Cardiovascular: No evidence of thoracic aortic aneurysm or
dissection. Mild atherosclerosis of the aortic arch and descending
thoracic aorta.

The heart is unremarkable without pericardial effusion. While not
optimized for opacification of the pulmonary vasculature, there is
sufficient contrast enhancement to exclude pulmonary emboli. No
filling defects.

Mediastinum/Nodes: No enlarged mediastinal, hilar, or axillary lymph
nodes. Thyroid gland, trachea, and esophagus demonstrate no
significant findings.

Lungs/Pleura: Upper lobe predominant emphysema, with a large bulla
at the left apex. No acute airspace disease, effusion, or
pneumothorax. Central airways are patent.

Upper Abdomen: No acute abnormality.

Musculoskeletal: No acute or destructive bony lesions. Reconstructed
imaging demonstrates no additional findings.

Review of the MIP images confirms the above findings.
IMPRESSION: 1. No evidence of thoracic aortic aneurysm or dissection.
2. No acute pulmonary embolus.
3. Aortic Atherosclerosis (SK7OF-73W.W) and Emphysema ([id number]).

## 2023-03-01 ENCOUNTER — Other Ambulatory Visit: Payer: Self-pay | Admitting: Pulmonary Disease

## 2023-03-10 ENCOUNTER — Other Ambulatory Visit (HOSPITAL_COMMUNITY): Payer: Self-pay | Admitting: Nurse Practitioner

## 2023-03-10 DIAGNOSIS — Z1231 Encounter for screening mammogram for malignant neoplasm of breast: Secondary | ICD-10-CM

## 2023-03-24 ENCOUNTER — Ambulatory Visit (HOSPITAL_COMMUNITY)
Admission: RE | Admit: 2023-03-24 | Discharge: 2023-03-24 | Disposition: A | Discharge status: Home / Self Care | Payer: BC Managed Care – PPO | Source: Ambulatory Visit | Attending: Nurse Practitioner | Admitting: Nurse Practitioner

## 2023-03-24 DIAGNOSIS — Z1231 Encounter for screening mammogram for malignant neoplasm of breast: Secondary | ICD-10-CM | POA: Insufficient documentation

## 2023-06-26 ENCOUNTER — Telehealth: Payer: Self-pay | Admitting: Family Medicine

## 2023-06-26 MED ORDER — BREZTRI AEROSPHERE 160-9-4.8 MCG/ACT IN AERO: 2.0000 | INHALATION_SPRAY | Freq: Two times a day (BID) | RESPIRATORY_TRACT | 2 refills | Status: DC

## 2023-06-26 NOTE — Telephone Encounter (Signed)
Spoke with patient regarding prior message . Approved refill for [facility] sent into [facility] [place] . Made also has a f/u with Beth on 08/26/2023.Patient was advised to keep f/u to get further refill's.Patient's voice was understanding. Nothing else further needed.

## 2023-06-26 NOTE — Telephone Encounter (Signed)
Patient states she has been trying for a few days to get a refill ion her Breztri inhaler. She is completely out   [facility] [place] for this refill---patient call back [phone]

## 2023-08-26 ENCOUNTER — Ambulatory Visit: Payer: BC Managed Care – PPO | Admitting: Primary Care

## 2023-08-26 NOTE — Progress Notes (Deleted)
 @Patient  ID: [name] [name], female    DOB: 01/04/1961, 63 y.o.   MRN: [id number]  No chief complaint on file.   Referring provider: Hairfield, Keavie C, NP  HPI: 63 year old female, former smoker quit in 2013.  Past medical history significant for emphysema, coronary artery disease, anxiety/depression, hyperlipidemia.  Former patient of Dr. Shellia last seen in office on 01/14/2022.  Patient had a home sleep study in March 2023 that showed mild obstructive sleep apnea, SpO2 low 85%.  Patient limited by TMJ issues for treatment with oral appliance, she elected to work on weight loss as first.  08/26/2023 Patient presents today for overdue follow-up/mild OSA and emphysema.  She is maintained on Breztri  for emphysema, Trelegy caused voice hoarseness.    Allergies  Allergen Reactions   Ciprofloxacin Diarrhea    c-diff per patient    Penicillins     REACTION: rash   Sulfamethoxazole-Trimethoprim     REACTION: SOB   Tramadol Hcl     REACTION: facial swelling    Immunization History  Administered Date(s) Administered   Influenza,inj,Quad PF,6+ Mos 06/12/2021   PNEUMOCOCCAL CONJUGATE-20 08/17/2021    Past Medical History:  Diagnosis Date   Anxiety and depression    CAD (coronary artery disease)    Nonobstructive 2008    COPD (chronic obstructive pulmonary disease) with emphysema (HCC)    GERD (gastroesophageal reflux disease)    Irritable bowel syndrome    Migraine headache    OSA (obstructive sleep apnea)    Pericardial cyst    Resected 2010    Recurrent chest pain     Tobacco History: Social History   Tobacco Use  Smoking Status Former   Current packs/day: 0.00   Types: Cigarettes   Quit date: 08/13/2011   Years since quitting: 12.0  Smokeless Tobacco Never  Tobacco Comments   Smoked for 20-some years   Counseling given: Not Answered Tobacco comments: Smoked for 20-some years   Outpatient Medications Prior to Visit  Medication Sig Dispense Refill    albuterol  (VENTOLIN  HFA) 108 (90 Base) MCG/ACT inhaler Inhale 1 puff into the lungs as needed for wheezing or shortness of breath.     aspirin  EC 81 MG tablet Take 1 tablet (81 mg total) by mouth daily. 90 tablet 3   Budeson-Glycopyrrol-Formoterol  (BREZTRI  AEROSPHERE) 160-9-4.8 MCG/ACT AERO Inhale 2 puffs into the lungs 2 (two) times daily. **NEEDS OV FOR FURTHER REFILLS** 10.7 g 2   ibandronate (BONIVA) 150 MG tablet Take 150 mg by mouth every 30 (thirty) days. Take in the morning with a full glass of water, on an empty stomach, and do not take anything else by mouth or lie down for the next 30 min.     nitroGLYCERIN  (NITROSTAT ) 0.4 MG SL tablet place one tablet UNDER THE TONGUE EVERY FIVE minutes AS NEEDED FOR CHEST pain 25 tablet 3   rosuvastatin  (CRESTOR ) 20 MG tablet Take 1 tablet (20 mg total) by mouth daily. 30 tablet 0   vitamin B-12 (CYANOCOBALAMIN) 1000 MCG tablet Take 1,000 mcg by mouth daily.     No facility-administered medications prior to visit.      Review of Systems  Review of Systems   Physical Exam  There were no vitals taken for this visit. Physical Exam   Lab Results:  CBC    Component Value Date/Time   WBC 4.4 07/26/2022 0809   RBC 4.15 07/26/2022 0809   HGB 13.7 07/26/2022 0809   HGB 13.3 08/17/2021 0940   HCT 40.7  07/26/2022 0809   HCT 39.4 08/17/2021 0940   PLT 314 07/26/2022 0809   PLT 302 08/17/2021 0940   MCV 98.1 07/26/2022 0809   MCV 96 08/17/2021 0940   MCH 33.0 07/26/2022 0809   MCHC 33.7 07/26/2022 0809   RDW 12.7 07/26/2022 0809   RDW 12.8 08/17/2021 0940   LYMPHSABS 1.5 08/17/2021 0940   MONOABS 0.6 05/20/2007 0929   EOSABS 0.1 08/17/2021 0940   BASOSABS 0.0 08/17/2021 0940    BMET    Component Value Date/Time   NA 142 07/26/2022 0809   NA 142 08/17/2021 0940   K 4.5 07/26/2022 0809   CL 109 07/26/2022 0809   CO2 28 07/26/2022 0809   GLUCOSE 99 07/26/2022 0809   BUN 14 07/26/2022 0809   BUN 10 08/17/2021 0940   CREATININE  0.62 07/26/2022 0809   CALCIUM  9.5 07/26/2022 0809   GFRNONAA >60 07/26/2022 0809   GFRAA  06/28/2009 0410    >60        The eGFR has been calculated using the MDRD equation. This calculation has not been validated in all clinical situations. eGFR's persistently <60 mL/min signify possible Chronic Kidney Disease.    BNP No results found for: BNP  ProBNP No results found for: PROBNP  Imaging: No results found.   Assessment & Plan:   No problem-specific Assessment & Plan notes found for this encounter.     Almarie LELON Ferrari, NP 08/26/2023

## 2023-10-29 ENCOUNTER — Ambulatory Visit: Payer: Self-pay | Admitting: Primary Care

## 2023-10-29 ENCOUNTER — Ambulatory Visit: Payer: BC Managed Care – PPO | Admitting: Primary Care

## 2023-10-29 VITALS — BP 116/78 | HR 89 | Ht 63.5 in | Wt 160.0 lb

## 2023-10-29 DIAGNOSIS — J302 Other seasonal allergic rhinitis: Secondary | ICD-10-CM | POA: Diagnosis not present

## 2023-10-29 DIAGNOSIS — J449 Chronic obstructive pulmonary disease, unspecified: Secondary | ICD-10-CM | POA: Diagnosis not present

## 2023-10-29 DIAGNOSIS — J432 Centrilobular emphysema: Secondary | ICD-10-CM

## 2023-10-29 DIAGNOSIS — G4733 Obstructive sleep apnea (adult) (pediatric): Secondary | ICD-10-CM | POA: Diagnosis not present

## 2023-10-29 MED ORDER — BREZTRI AEROSPHERE 160-9-4.8 MCG/ACT IN AERO: 2.0000 | INHALATION_SPRAY | Freq: Two times a day (BID) | RESPIRATORY_TRACT | 11 refills | Status: DC

## 2023-10-29 NOTE — Progress Notes (Signed)
 @Patient  ID: [name] [name], female    DOB: Jun 24, 1961, 63 y.o.   MRN: [id number]  Chief Complaint  Patient presents with   Follow-up    Needing refills on Breaztri. Breathing has overall been doing well. She has occ SOB- relates to pollen allergy- just started using loratidine 2 wks ago.     Referring provider: Wendall Papa, NP  HPI: 63 year old female, former smoker quit in 2013.  Past medical history significant for COPD, bullous emphysema, mild OSA, coronary artery disease, hyperlipidemia.   10/29/2023 Discussed the use of AI scribe software for clinical note transcription with the patient, who gave verbal consent to proceed.  [name] [name] is a 63 year old female with COPD emphysema who presents for a routine follow-up.  She is currently using Breztri inhaler after being unable to tolerate Trelegy due to voice loss. She experiences occasional shortness of breath, particularly when sitting at work, but has not noticed significant changes in her breathing. There have been no recent exacerbations or hospitalizations for respiratory issues. Her last pulmonary function test in 2022 showed moderate obstructive lung disease with decreased diffusion capacity. She has received the flu and pneumonia vaccines, and genetic testing for alpha-1 antitrypsin deficiency was normal.  She has mild sleep apnea, identified in a sleep study reviewed in June 2023. Her boyfriend reports that she snores, and she sometimes wakes up gasping or choking. She feels tired during the day and has not been able to achieve weight loss, despite attempts. Her BMI is 27. She sleeps on her side most of the time and goes to bed around midnight, waking up at 5:30 AM.  She has a history of TMJ issues, which occasionally cause her jaw to lock when yawning.  She experiences seasonal allergies and uses over-the-counter allergy medications, including Claritin and a generic brand. She plans to start using Claritin  regularly.      Imaging: CT angio chest 05/21/21 >> centrilobular emphysema with upper lobe predominance, large bulla in Lt apex  Sleep test HST 11/07/21 >> AHI 13, SpO2 low 85%        Allergies  Allergen Reactions   Ciprofloxacin Diarrhea    c-diff per patient    Penicillins     REACTION: rash   Sulfamethoxazole-Trimethoprim     REACTION: SOB   Tramadol Hcl     REACTION: facial swelling    Immunization History  Administered Date(s) Administered   Influenza,inj,Quad PF,6+ Mos 06/12/2021   PNEUMOCOCCAL CONJUGATE-20 08/17/2021    Past Medical History:  Diagnosis Date   Anxiety and depression    CAD (coronary artery disease)    Nonobstructive 2008    COPD (chronic obstructive pulmonary disease) with emphysema (HCC)    GERD (gastroesophageal reflux disease)    Irritable bowel syndrome    Migraine headache    OSA (obstructive sleep apnea)    Pericardial cyst    Resected 2010    Recurrent chest pain     Tobacco History: Social History   Tobacco Use  Smoking Status Former   Current packs/day: 0.00   Types: Cigarettes   Quit date: 08/13/2011   Years since quitting: 12.2  Smokeless Tobacco Never  Tobacco Comments   Smoked for 20-some years   Counseling given: Not Answered Tobacco comments: Smoked for 20-some years   Outpatient Medications Prior to Visit  Medication Sig Dispense Refill   albuterol (VENTOLIN HFA) 108 (90 Base) MCG/ACT inhaler Inhale 1 puff into the lungs as needed for wheezing or  shortness of breath.     aspirin EC 81 MG tablet Take 1 tablet (81 mg total) by mouth daily. 90 tablet 3   Budeson-Glycopyrrol-Formoterol (BREZTRI AEROSPHERE) 160-9-4.8 MCG/ACT AERO Inhale 2 puffs into the lungs 2 (two) times daily. **NEEDS OV FOR FURTHER REFILLS** 10.7 g 2   ibandronate (BONIVA) 150 MG tablet Take 150 mg by mouth every 30 (thirty) days. Take in the morning with a full glass of water, on an empty stomach, and do not take anything else by mouth or lie  down for the next 30 min.     loratadine (CLARITIN) 10 MG tablet Take 10 mg by mouth daily.     nitroGLYCERIN (NITROSTAT) 0.4 MG SL tablet place one tablet UNDER THE TONGUE EVERY FIVE minutes AS NEEDED FOR CHEST pain 25 tablet 3   rosuvastatin (CRESTOR) 20 MG tablet Take 1 tablet (20 mg total) by mouth daily. 30 tablet 0   vitamin B-12 (CYANOCOBALAMIN) 1000 MCG tablet Take 1,000 mcg by mouth daily.     No facility-administered medications prior to visit.    Review of Systems  Review of Systems  Constitutional: Negative.   HENT:  Positive for postnasal drip.   Respiratory:  Positive for shortness of breath.   Cardiovascular: Negative.    Physical Exam  BP 116/78 (BP Location: Left Arm, Cuff Size: Normal)   Pulse 89   Ht 5' 3.5" (1.613 m)   Wt 160 lb (72.6 kg)   SpO2 96%   BMI 27.90 kg/m  Physical Exam Constitutional:      General: She is not in acute distress.    Appearance: Normal appearance. She is not ill-appearing.  HENT:     Head: Normocephalic and atraumatic.  Cardiovascular:     Rate and Rhythm: Normal rate and regular rhythm.  Pulmonary:     Effort: Pulmonary effort is normal.     Breath sounds: Normal breath sounds. No wheezing, rhonchi or rales.  Neurological:     General: No focal deficit present.     Mental Status: She is alert and oriented to person, place, and time. Mental status is at baseline.  Psychiatric:        Mood and Affect: Mood normal.        Behavior: Behavior normal.        Thought Content: Thought content normal.        Judgment: Judgment normal.      Lab Results:  CBC    Component Value Date/Time   WBC 4.4 07/26/2022 0809   RBC 4.15 07/26/2022 0809   HGB 13.7 07/26/2022 0809   HGB 13.3 08/17/2021 0940   HCT 40.7 07/26/2022 0809   HCT 39.4 08/17/2021 0940   PLT 314 07/26/2022 0809   PLT 302 08/17/2021 0940   MCV 98.1 07/26/2022 0809   MCV 96 08/17/2021 0940   MCH 33.0 07/26/2022 0809   MCHC 33.7 07/26/2022 0809   RDW 12.7  07/26/2022 0809   RDW 12.8 08/17/2021 0940   LYMPHSABS 1.5 08/17/2021 0940   MONOABS 0.6 05/20/2007 0929   EOSABS 0.1 08/17/2021 0940   BASOSABS 0.0 08/17/2021 0940    BMET    Component Value Date/Time   NA 142 07/26/2022 0809   NA 142 08/17/2021 0940   K 4.5 07/26/2022 0809   CL 109 07/26/2022 0809   CO2 28 07/26/2022 0809   GLUCOSE 99 07/26/2022 0809   BUN 14 07/26/2022 0809   BUN 10 08/17/2021 0940   CREATININE 0.62 07/26/2022 0809  CALCIUM 9.5 07/26/2022 0809   GFRNONAA >60 07/26/2022 0809   GFRAA  06/28/2009 0410    >60        The eGFR has been calculated using the MDRD equation. This calculation has not been validated in all clinical situations. eGFR's persistently <60 mL/min signify possible Chronic Kidney Disease.    BNP No results found for: "BNP"  ProBNP No results found for: "PROBNP"  Imaging: No results found.   Assessment & Plan:   No problem-specific Assessment & Plan notes found for this encounter.  Assessment and Plan    Chronic Obstructive Pulmonary Disease (COPD) with Emphysema COPD with bullous emphysema managed with Breztri inhaler. No exacerbations. Moderate obstructive lung disease with decreased diffusion capacity. Emphysema attributed to past smoking. Surgical options not indicated at this time. - Refill Breztri inhaler. - Schedule repeat pulmonary function test in 6-12 months. - Consider surgical options like bullectomy if symptoms or lung function worsen.  Mild Obstructive Sleep Apnea Mild obstructive sleep apnea with snoring and occasional gasping. BMI 27. Minimal cardiac risk. Prefers monitoring, side sleeping, and weight loss. - Encourage weight loss with a goal of reaching 140-145 lbs. - Consider CPAP or oral appliance if symptoms worsen. - Advise on positional therapy, including side sleeping and use of a wedge pillow.  Seasonal Allergies Uses over-the-counter allergy medications. Plans to start Claritin. Advised on nasal  steroids options. - Start Claritin. - Consider using Flonase or Nasacort nasal spray as needed for rhinitis or postnasal drip.       Glenford Bayley, NP 10/29/2023

## 2023-10-29 NOTE — Patient Instructions (Addendum)
-  CHRONIC OBSTRUCTIVE PULMONARY DISEASE (COPD) WITH EMPHYSEMA: COPD with emphysema is a chronic lung condition often caused by smoking, leading to breathing difficulties. Your condition is currently managed with the Breztri inhaler, and there have been no recent exacerbations. We will refill your Breztri inhaler and schedule a repeat pulmonary function test in 6-12 months. If your symptoms or lung function worsen, we may consider surgical options like a bullectomy.  -MILD OBSTRUCTIVE SLEEP APNEA: Mild obstructive sleep apnea is a condition where your breathing stops and starts during sleep, often causing snoring and daytime tiredness. We encourage you to aim for a weight loss goal of reaching 140-145 lbs. If your symptoms worsen, we may consider CPAP therapy or an oral appliance. Positional therapy, such as side sleeping and using a wedge pillow, is also advised.  -SEASONAL ALLERGIES: Seasonal allergies cause symptoms like sneezing, runny nose, and itchy eyes. You plan to start using Claritin regularly. We also recommend considering nasal sprays like Flonase or Nasacort as needed for additional relief.   Follow-up PFTs in 6 months with virtual visit after to review results

## 2023-10-31 ENCOUNTER — Encounter: Payer: Self-pay | Admitting: Cardiology

## 2023-12-26 ENCOUNTER — Telehealth: Payer: Self-pay

## 2023-12-26 NOTE — Telephone Encounter (Signed)
 Copied from CRM [id number]. Topic: Clinical - Prescription Issue >> Dec 26, 2023 11:18 AM Hilton Lucky wrote: Reason for CRM: Patient is calling to state that her pharmacy states her insurance will not cover Breztri . Would like a different rx, alternative, or cost reduction. Notes interested in Trelegy again, as pharmacy stataes they will cover it. Please c/b to patient. >> Dec 26, 2023  1:19 PM Emylou G wrote: Budesonide / Formoterol is the generic for the [facility].. Ins company said they would cover that generic.Aaron Aas Pls advise on the refill.. The dosage 80-4.5 if you need it

## 2023-12-26 NOTE — Telephone Encounter (Signed)
 Copied from CRM [id number]. Topic: Clinical - Prescription Issue >> Dec 26, 2023 11:18 AM Hilton Lucky wrote: Reason for CRM: Patient is calling to state that her pharmacy states her insurance will not cover Breztri . Would like a different rx, alternative, or cost reduction. Notes interested in Trelegy again, as pharmacy stataes they will cover it. Please c/b to patient.

## 2023-12-26 NOTE — Telephone Encounter (Signed)
 Per [facility] patient has switched to [employer] and Breztri  is no longer covered on her new plan. Also she has filled Breztri  on the following dates: 08/27/23 1 inhaler 03/05/23 1 inhaler  Spoke with patient and she admits to not taking Breztri  as directed. She sometimes takes 2 puffs QAM, but admits to "missing a lot of doses". She feels she does ok without it. She would still like to have a maintenance inhaler. She states Trelegy caused hoarseness and this is why she was switched to Breztri . She is hesitant to try this again but will if it is her only option. Advised patient to contact ins co for covered alternatives to Breztri  and let us  know. She will call us  back or send MyChart message.  Leaving encounter open pending return phone call/MC message.

## 2023-12-29 NOTE — Telephone Encounter (Signed)
 Erroneous encounter. Patient not established with [facility].

## 2023-12-30 NOTE — Telephone Encounter (Signed)
 Per patient: Her insurance will cover Budesonide/Formoterol 80-4.5. [facility], [place], [place].   Please advise, thanks!

## 2023-12-30 NOTE — Telephone Encounter (Signed)
 Copied from CRM [id number]. Topic: Clinical - Prescription Issue >> Dec 26, 2023 11:18 AM Hilton Lucky wrote: Reason for CRM: Patient is calling to state that her pharmacy states her insurance will not cover Breztri . Would like a different rx, alternative, or cost reduction. Notes interested in Trelegy again, as pharmacy stataes they will cover it. Please c/b to patient. >> Dec 30, 2023 10:32 AM Lizabeth Riggs wrote: Her insurance will cover Budesonide/Formoterol 80 4.5. [facility], [place], [place].   Nakiyah does not want  Trelegy .  Please send message through MyChart if there are any questions.  Thanks >> Dec 26, 2023  1:19 PM Emylou G wrote: Budesonide / Formoterol is the generic for the [facility].. Ins company said they would cover that generic.Aaron Aas Pls advise on the refill.. The dosage 80-4.5 if you need it

## 2024-01-04 ENCOUNTER — Other Ambulatory Visit: Payer: Self-pay | Admitting: Primary Care

## 2024-01-06 MED ORDER — BUDESONIDE-FORMOTEROL FUMARATE 80-4.5 MCG/ACT IN AERO: 2.0000 | INHALATION_SPRAY | Freq: Two times a day (BID) | RESPIRATORY_TRACT | 5 refills | Status: AC

## 2024-01-06 MED ORDER — SPIRIVA RESPIMAT 2.5 MCG/ACT IN AERS: 2.0000 | INHALATION_SPRAY | Freq: Every day | RESPIRATORY_TRACT | 5 refills | Status: DC

## 2024-01-06 NOTE — Telephone Encounter (Signed)
 Called the pt and there was no answer- LMTCB

## 2024-01-06 NOTE — Telephone Encounter (Signed)
 Please advise on what inhaler is covered under patient's plan.  Thank you.

## 2024-01-06 NOTE — Telephone Encounter (Signed)
 Would need to take Budesonide -formoterol  (Symbicort ) PLUS Tiotropium Bromide (Spiriva )  I have sent RX

## 2024-01-07 NOTE — Telephone Encounter (Signed)
 Spoke with patient and she is aware of dual inhaler use. Reviewed dual inhaler usage. She has no additional questions or concerns at this time.

## 2024-03-10 DIAGNOSIS — E7800 Pure hypercholesterolemia, unspecified: Secondary | ICD-10-CM | POA: Diagnosis not present

## 2024-03-10 DIAGNOSIS — R5383 Other fatigue: Secondary | ICD-10-CM | POA: Diagnosis not present

## 2024-03-10 DIAGNOSIS — Z79899 Other long term (current) drug therapy: Secondary | ICD-10-CM | POA: Diagnosis not present

## 2024-03-10 DIAGNOSIS — Z0000 Encounter for general adult medical examination without abnormal findings: Secondary | ICD-10-CM | POA: Diagnosis not present

## 2024-03-10 DIAGNOSIS — M810 Age-related osteoporosis without current pathological fracture: Secondary | ICD-10-CM | POA: Diagnosis not present

## 2024-03-30 ENCOUNTER — Other Ambulatory Visit (HOSPITAL_COMMUNITY): Payer: Self-pay | Admitting: Nurse Practitioner

## 2024-03-30 DIAGNOSIS — Z1231 Encounter for screening mammogram for malignant neoplasm of breast: Secondary | ICD-10-CM

## 2024-04-05 ENCOUNTER — Ambulatory Visit (HOSPITAL_COMMUNITY)
Admission: RE | Admit: 2024-04-05 | Discharge: 2024-04-05 | Disposition: A | Discharge status: Home / Self Care | Source: Ambulatory Visit | Attending: Nurse Practitioner | Admitting: Nurse Practitioner

## 2024-04-05 DIAGNOSIS — Z1231 Encounter for screening mammogram for malignant neoplasm of breast: Secondary | ICD-10-CM | POA: Insufficient documentation

## 2024-04-15 ENCOUNTER — Ambulatory Visit (HOSPITAL_COMMUNITY)
Admission: RE | Admit: 2024-04-15 | Discharge: 2024-04-15 | Disposition: A | Discharge status: Home / Self Care | Source: Ambulatory Visit | Attending: Primary Care | Admitting: Primary Care

## 2024-04-15 DIAGNOSIS — J432 Centrilobular emphysema: Secondary | ICD-10-CM | POA: Diagnosis not present

## 2024-04-18 LAB — PULMONARY FUNCTION TEST
DL/VA % pred: 73 %
DL/VA: 3.15 ml/min/mmHg/L
DLCO unc % pred: 72 %
DLCO unc: 13.44 ml/min/mmHg
FEF 25-75 Pre: 0.91 L/s
FEF2575-%Pred-Pre: 42 %
FEV1-%Pred-Pre: 77 %
FEV1-Pre: 1.8 L
FEV1FVC-%Pred-Pre: 87 %
FEV6-%Pred-Pre: 90 %
FEV6-Pre: 2.62 L
FEV6FVC-%Pred-Pre: 102 %
FVC-%Pred-Pre: 88 %
Pre FEV1/FVC ratio: 68 %
Pre FEV6/FVC Ratio: 99 %
RV % pred: 139 %
RV: 2.68 L
TLC % pred: 115 %
TLC: 5.5 L

## 2024-04-29 ENCOUNTER — Telehealth: Payer: Self-pay

## 2024-04-29 NOTE — Telephone Encounter (Signed)
 Copied from CRM [id number]. Topic: Clinical - Lab/Test Results >> Apr 29, 2024 10:28 AM [name] [name] wrote: Reason for CRM: Patient is calling to get the test results from her PFT. Please give a call back to go over results/follow-up with patient.  Please advise

## 2024-04-29 NOTE — Telephone Encounter (Signed)
  Pulmonary function testing showed minimal obstructive airway disease/ COPD with emphysema No decline in lung function when compared to 2022, actually better  If any further questions set her up with with one of the new doctors to establish care

## 2024-04-29 NOTE — Telephone Encounter (Signed)
 See phone note

## 2024-04-29 NOTE — Telephone Encounter (Signed)
 Called and informed pt of Beths notes and pt understood and confirmed. Not interested in any appt at this time but will call if she wants one. nfn

## 2024-09-08 ENCOUNTER — Other Ambulatory Visit: Payer: Self-pay

## 2024-09-08 ENCOUNTER — Telehealth: Payer: Self-pay

## 2024-09-08 MED ORDER — SPIRIVA RESPIMAT 2.5 MCG/ACT IN AERS: 2.0000 | INHALATION_SPRAY | Freq: Every day | RESPIRATORY_TRACT | 1 refills | Status: AC

## 2024-09-08 NOTE — Telephone Encounter (Signed)
 Copied from CRM #[id number]. Topic: Clinical - Medication Refill >> Sep 02, 2024 12:45 PM Rilla B wrote: Medication: Tiotropium Bromide Monohydrate  (SPIRIVA  RESPIMAT) 2.5 MCG/ACT AERS budesonide -formoterol  (SYMBICORT ) 80-4.5 MCG/ACT inhaler  Has the patient contacted their pharmacy? Yes (Agent: If no, request that the patient contact the pharmacy for the refill. If patient does not wish to contact the pharmacy document the reason why and proceed with request.) (Agent: If yes, when and what did the pharmacy advise?)  This is the patient's preferred pharmacy:  [id number] [place], [place] - [address] [facility] [address] [place] [address] Phone: [phone] Fax: [phone]  Is this the correct pharmacy for this prescription? Yes If no, delete pharmacy and type the correct one.   Has the prescription been filled recently? Yes  Is the patient out of the medication? No  Has the patient been seen for an appointment in the last year OR does the patient have an upcoming appointment? Yes  Can we respond through MyChart? Yes  Agent: Please be advised that Rx refills may take up to 3 business days. We ask that you follow-up with your pharmacy.  SENT 2 MONTHS SUPPLY BUT NEEDS APPT FOR FURTHER REFILLS
